# Patient Record
Sex: Female | Born: 1995
Health system: Southern US, Community
[De-identification: ages and names within clinical notes are randomized; demographics above are authoritative.]

## PROBLEM LIST (undated history)

## (undated) DIAGNOSIS — J309 Allergic rhinitis, unspecified: Secondary | ICD-10-CM

## (undated) HISTORY — DX: Allergic rhinitis, unspecified: J30.9

---
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
## 2017-10-16 NOTE — Progress Notes (Signed)
Complete Physical  Assessment and Plan: Medication management -     CBC with Differential/Platelet -     BASIC METABOLIC PANEL WITH GFR -     Hepatic function panel -     Magnesium  Vitamin D deficiency Will not check today, she is NOT on vitamin D, will get on it  Routine general medical examination at a health care facility  Discussed STD testing, safe sex, alcohol and drug awareness, drinking and driving dangers, wearing a seat belt and general safety measures for young adult. - not sexually active, no needs for PAP at this time  BMI 23.0-23.9, adult Monitor  Screening cholesterol level -     Lipid panel  Screening, anemia, deficiency, iron -     Iron,Total/Total Iron Binding Cap -     Vitamin B12  Screening for thyroid disorder -     TSH  Encounter for PPD test -     TB Skin Test  Screening for cardiovascular condition -     EKG 12-Lead   Discussed med's effects and SE's. Screening labs and tests as requested with regular follow-up as recommended. Over 40 minutes of exam, counseling, chart review and critical decision making was performed  HPI  This very nice 22 y.o.female presents for complete physical.  Patient has no major health issues.  Patient reports no complaints at this time.  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 She needs TB and Hep B records.   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 She is doing well, living at home, commuting.  She does workout, sporadic.  Finally, patient has history of Vitamin D Deficiency and last vitamin D was  Lab Results  Component Value Date   VD25OH 26 (L) 03/15/2015   Currently on supplementation  BMI is Body mass index is 23.76 kg/m., she is working on diet and exercise. Wt Readings from Last 3 Encounters:  10/19/17 142 lb 12.8 oz (64.8 kg)  12/03/16 143 lb 3.2 oz (65 kg) (74 %, Z= 0.66)*  10/09/16 146 lb (66.2 kg) (78 %, Z= 0.77)*   * Growth percentiles are based on  CDC (Girls, 2-20 Years) data.    Current Medications:  Current Outpatient Medications on File Prior to Visit  Medication Sig Dispense Refill  . albuterol (VENTOLIN HFA) 108 (90 Base) MCG/ACT inhaler Inhale 2 puffs into the lungs every 4 (four) hours as needed for wheezing or shortness of breath. 1 Inhaler 0  . cetirizine (ZYRTEC) 10 MG tablet Take 10 mg by mouth daily.    . fluticasone (FLONASE) 50 MCG/ACT nasal spray Place 2 sprays into both nostrils at bedtime. 16 g 1  . Multiple Vitamins-Minerals (MULTIVITAMIN PO) Take by mouth daily.    Marland Kitchen OVER THE COUNTER MEDICATION daily. Okra Pepcid     No current facility-administered medications on file prior to visit.    Health Maintenance:   Immunization History  Administered Date(s) Administered  . HPV 9-valent 03/15/2015, 05/28/2015, 09/06/2015  . Meningococcal Conjugate 05/30/2014    TD/TDAP: 2014 Influenza: did not get it Pneumovax: N/A Prevnar 13: N/A HPV vaccines: completed meningitis vaccine 2015  LMP: Patient's last menstrual period was 10/01/2017. Sexually Active: no STD testing offered Pap: not needed MGM: N/A  Allergies:  Allergies  Allergen Reactions  . Amoxicillin Hives   Medical History:  does not have a problem list on file. Surgical History:  She  has no past surgical history on file. Family History:  Her family history is not on file. Social History:   reports that she has never smoked. She has never used smokeless tobacco. She reports that she does not drink alcohol or use drugs.  Review of Systems: Review of Systems  Constitutional: Negative.   HENT: Negative.   Eyes: Negative.   Respiratory: Negative.   Cardiovascular: Negative.   Gastrointestinal: Negative.   Genitourinary: Negative.   Musculoskeletal: Negative.   Skin: Negative.     Physical Exam: Estimated body mass index is 23.76 kg/m as calculated from the following:   Height as of this encounter: 5\' 5"  (1.651 m).   Weight as of this  encounter: 142 lb 12.8 oz (64.8 kg). BP 112/74   Pulse 76   Temp (!) 97.5 F (36.4 C)   Resp 16   Ht 5\' 5"  (1.651 m)   Wt 142 lb 12.8 oz (64.8 kg)   LMP 10/01/2017   SpO2 97%   BMI 23.76 kg/m  General Appearance: Well nourished, in no apparent distress.  Eyes: PERRLA, EOMs, conjunctiva no swelling or erythema, normal fundi and vessels.  Sinuses: No Frontal/maxillary tenderness  ENT/Mouth: Ext aud canals clear, normal light reflex with TMs without erythema, bulging. Good dentition. No erythema, swelling, or exudate on post pharynx. Tonsils not swollen or erythematous. Hearing normal.  Neck: Supple, thyroid normal. No bruits  Respiratory: Respiratory effort normal, BS equal bilaterally without rales, rhonchi, wheezing or stridor.  Cardio: RRR without murmurs, rubs or gallops. Brisk peripheral pulses without edema.  Chest: symmetric, with normal excursions and percussion.  Breasts: Symmetric, without lumps, nipple discharge, retractions.  Abdomen: Soft, nontender, no guarding, rebound, hernias, masses, or organomegaly.  Lymphatics: Non tender without lymphadenopathy.  Genitourinary:  Musculoskeletal: Full ROM all peripheral extremities,5/5 strength, and normal gait.  Skin: Warm, dry without rashes, lesions, ecchymosis. Neuro: Cranial nerves intact, reflexes equal bilaterally. Normal muscle tone, no cerebellar symptoms. Sensation intact.  Psych: Awake and oriented X 3, normal affect, Insight and Judgment appropriate.   EKG: defer  Natasha MullingAmanda Brentley Woods 10:06 AM [facility]

## 2017-10-19 ENCOUNTER — Ambulatory Visit (INDEPENDENT_AMBULATORY_CARE_PROVIDER_SITE_OTHER): Payer: 59 | Admitting: Physician Assistant

## 2017-10-19 ENCOUNTER — Encounter: Payer: Self-pay | Admitting: Physician Assistant

## 2017-10-19 VITALS — BP 112/74 | HR 76 | Temp 97.5°F | Resp 16 | Ht 65.0 in | Wt 142.8 lb

## 2017-10-19 DIAGNOSIS — Z1329 Encounter for screening for other suspected endocrine disorder: Secondary | ICD-10-CM | POA: Diagnosis not present

## 2017-10-19 DIAGNOSIS — Z130 Encounter for screening for diseases of the blood and blood-forming organs and certain disorders involving the immune mechanism: Secondary | ICD-10-CM

## 2017-10-19 DIAGNOSIS — Z111 Encounter for screening for respiratory tuberculosis: Secondary | ICD-10-CM

## 2017-10-19 DIAGNOSIS — Z79899 Other long term (current) drug therapy: Secondary | ICD-10-CM | POA: Diagnosis not present

## 2017-10-19 DIAGNOSIS — Z136 Encounter for screening for cardiovascular disorders: Secondary | ICD-10-CM

## 2017-10-19 DIAGNOSIS — E559 Vitamin D deficiency, unspecified: Secondary | ICD-10-CM

## 2017-10-19 DIAGNOSIS — Z6823 Body mass index (BMI) 23.0-23.9, adult: Secondary | ICD-10-CM

## 2017-10-19 DIAGNOSIS — Z0000 Encounter for general adult medical examination without abnormal findings: Secondary | ICD-10-CM | POA: Diagnosis not present

## 2017-10-19 DIAGNOSIS — Z13220 Encounter for screening for lipoid disorders: Secondary | ICD-10-CM | POA: Diagnosis not present

## 2017-10-19 NOTE — Patient Instructions (Signed)
  Vitamin D goal is between 60-80  Please make sure that you are taking your Vitamin D as directed.   It is very important as a natural anti-inflammatory   helping hair, skin, and nails, as well as reducing stroke and heart attack risk.   It helps your bones and helps with mood.  We want you on at least 5000 IU daily  It also decreases numerous cancer risks so please take it as directed.   Low Vit D is associated with a 200-300% higher risk for CANCER   and 200-300% higher risk for HEART   ATTACK  &  STROKE.    ......................................  It is also associated with higher death rate at younger ages,   autoimmune diseases like Rheumatoid arthritis, Lupus, Multiple Sclerosis.     Also many other serious conditions, like depression, Alzheimer's  Dementia, infertility, muscle aches, fatigue, fibromyalgia - just to name a few.  +++++++++++++++++++  Can get liquid vitamin D from amazon  OR here in [place] at  [facility] [address], [place], [place] [address] Or you can try earth fare    

## 2017-10-20 LAB — BASIC METABOLIC PANEL WITH GFR
BUN: 11 mg/dL (ref 7–25)
CALCIUM: 9.5 mg/dL (ref 8.6–10.2)
CO2: 27 mmol/L (ref 20–32)
Chloride: 105 mmol/L (ref 98–110)
Creat: 0.77 mg/dL (ref 0.50–1.10)
GFR, EST AFRICAN AMERICAN: 129 mL/min/{1.73_m2} (ref >=60)
GFR, EST NON AFRICAN AMERICAN: 111 mL/min/{1.73_m2} (ref >=60)
Glucose, Bld: 90 mg/dL (ref 65–99)
Potassium: 4.6 mmol/L (ref 3.5–5.3)
Sodium: 138 mmol/L (ref 135–146)

## 2017-10-20 LAB — VITAMIN B12: Vitamin B-12: 366 pg/mL (ref 200–1100)

## 2017-10-20 LAB — IRON, TOTAL/TOTAL IRON BINDING CAP
%SAT: 23 % (calc) (ref 11–50)
IRON: 74 ug/dL (ref 40–190)
TIBC: 319 mcg/dL (calc) (ref 250–450)

## 2017-10-20 LAB — TSH: TSH: 1.17 m[IU]/L

## 2017-10-20 LAB — HEPATIC FUNCTION PANEL
AG Ratio: 1.7 (calc) (ref 1.0–2.5)
ALBUMIN MSPROF: 4.5 g/dL (ref 3.6–5.1)
ALT: 9 U/L (ref 6–29)
AST: 15 U/L (ref 10–30)
Alkaline phosphatase (APISO): 64 U/L (ref 33–115)
BILIRUBIN DIRECT: 0.1 mg/dL (ref 0.0–0.2)
GLOBULIN: 2.7 g/dL (ref 1.9–3.7)
Indirect Bilirubin: 0.3 mg/dL (calc) (ref 0.2–1.2)
Total Bilirubin: 0.4 mg/dL (ref 0.2–1.2)
Total Protein: 7.2 g/dL (ref 6.1–8.1)

## 2017-10-20 LAB — CBC WITH DIFFERENTIAL/PLATELET
Basophils Absolute: 32 cells/uL (ref 0–200)
Basophils Relative: 0.5 %
EOS PCT: 0.9 %
Eosinophils Absolute: 58 cells/uL (ref 15–500)
HCT: 40.6 % (ref 35.0–45.0)
Hemoglobin: 14 g/dL (ref 11.7–15.5)
Lymphs Abs: 1702 cells/uL (ref 850–3900)
MCH: 30 pg (ref 27.0–33.0)
MCHC: 34.5 g/dL (ref 32.0–36.0)
MCV: 86.9 fL (ref 80.0–100.0)
MONOS PCT: 8.6 %
MPV: 8.9 fL (ref 7.5–12.5)
NEUTROS PCT: 63.4 %
Neutro Abs: 4058 cells/uL (ref 1500–7800)
PLATELETS: 365 10*3/uL (ref 140–400)
RBC: 4.67 10*6/uL (ref 3.80–5.10)
RDW: 12 % (ref 11.0–15.0)
TOTAL LYMPHOCYTE: 26.6 %
WBC mixed population: 550 cells/uL (ref 200–950)
WBC: 6.4 10*3/uL (ref 3.8–10.8)

## 2017-10-20 LAB — LIPID PANEL
CHOL/HDL RATIO: 3.1 (calc) (ref <5.0)
CHOLESTEROL: 180 mg/dL (ref <200)
HDL: 59 mg/dL (ref >50)
LDL Cholesterol (Calc): 107 mg/dL (calc) — ABNORMAL HIGH
Non-HDL Cholesterol (Calc): 121 mg/dL (calc) (ref <130)
Triglycerides: 59 mg/dL (ref <150)

## 2017-10-20 LAB — MAGNESIUM: Magnesium: 1.8 mg/dL (ref 1.5–2.5)

## 2017-10-21 LAB — TB SKIN TEST
Induration: 0 mm
TB Skin Test: NEGATIVE

## 2018-07-19 ENCOUNTER — Ambulatory Visit (INDEPENDENT_AMBULATORY_CARE_PROVIDER_SITE_OTHER): Payer: 59 | Admitting: Adult Health

## 2018-07-19 ENCOUNTER — Encounter: Payer: Self-pay | Admitting: Adult Health

## 2018-07-19 VITALS — BP 114/72 | HR 100 | Temp 97.7°F | Ht 65.0 in | Wt 135.2 lb

## 2018-07-19 DIAGNOSIS — J0141 Acute recurrent pansinusitis: Secondary | ICD-10-CM | POA: Diagnosis not present

## 2018-07-19 DIAGNOSIS — J309 Allergic rhinitis, unspecified: Secondary | ICD-10-CM

## 2018-07-19 HISTORY — DX: Allergic rhinitis, unspecified: J30.9

## 2018-07-19 MED ORDER — AZITHROMYCIN 250 MG PO TABS: ORAL_TABLET | ORAL | 1 refills | Status: AC

## 2018-07-19 MED ORDER — PROMETHAZINE-DM 6.25-15 MG/5ML PO SYRP: 5.0000 mL | ORAL_SOLUTION | Freq: Four times a day (QID) | ORAL | 1 refills | Status: DC | PRN

## 2018-07-19 MED ORDER — PREDNISONE 20 MG PO TABS: ORAL_TABLET | ORAL | 0 refills | Status: DC

## 2018-07-19 NOTE — Patient Instructions (Signed)

## 2018-07-19 NOTE — Progress Notes (Signed)
Assessment and Plan:  Natasha Woods was seen today for acute visit.  Diagnoses and all orders for this visit:  Acute recurrent pansinusitis Suggested symptomatic OTC remedies. Nasal saline spray for congestion. Nasal steroids, allergy pill, oral steroids offered Add OTC decongestant - sudafed/phenylephrine Follow up as needed if not improving, ER precautions given during holidays for sudden or severe symptoms -     predniSONE (DELTASONE) 20 MG tablet; 2 tablets daily for 3 days, 1 tablet daily for 4 days. -     azithromycin (ZITHROMAX) 250 MG tablet; Take 2 tablets (500 mg) on  Day 1,  followed by 1 tablet (250 mg) once daily on Days 2 through 5. -     promethazine-dextromethorphan (PROMETHAZINE-DM) 6.25-15 MG/5ML syrup; Take 5 mLs by mouth 4 (four) times daily as needed for cough.  Further disposition pending results of labs. Discussed med's effects and SE's.   Over 15 minutes of exam, counseling, chart review, and critical decision making was performed.   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 She does endorse non-productive cough. Newly has chest congestion in the last 48 hours. She denies fever/chills, dyspnea, wheezing, chest pain, palpitations, dizziness, vision changes, severe pain, n/v.   She has hx of  allergies, still taking zyrtec, flonase. She does endorse hx of recurrent sinus issues.  She endorses sinus infection earlier this season that was treated by 3 abx total, cannot recall what had other than doxycycline which didn't help, she cannot take augmentin  No past medical history on file.   Allergies  Allergen Reactions  . Amoxicillin Hives    Current Outpatient Medications on File Prior to Visit  Medication Sig  . albuterol (VENTOLIN HFA) 108 (90 Base) MCG/ACT inhaler Inhale 2 puffs into the lungs every 4 (four) hours as needed for wheezing or shortness of breath.  . cetirizine (ZYRTEC) 10 MG tablet Take 10 mg by mouth daily.  . fluticasone (FLONASE) 50 MCG/ACT nasal spray Place 2 sprays into both nostrils at bedtime.  . Multiple Vitamins-Minerals (MULTIVITAMIN PO) Take by mouth daily.  Marland Kitchen. OVER THE COUNTER MEDICATION daily. Okra Pepcid   No current facility-administered medications on file prior to visit.     ROS: all negative except above.   Physical Exam:  BP 114/72   Pulse 100   Temp 97.7 F (36.5 C)   Ht 5\' 5"  (1.651 m)   Wt 135 lb 3.2 oz (61.3 kg)   LMP 07/11/2018   SpO2 97%   BMI 22.50 kg/m   General Appearance: Well nourished, in no apparent distress. Eyes: PERRLA, EOMs, conjunctiva no swelling or erythema Sinuses: Generalized Frontal/maxillary tenderness ENT/Mouth: Ext aud canals clear, TMs without erythema, bulging. Serous effusion to L. No erythema, swelling, or exudate on post pharynx.  Tonsils not notably swollen or erythematous. Uvula midline. Hearing normal.  Neck: Supple, thyroid normal.  Respiratory: Respiratory effort normal, BS equal bilaterally without rales, rhonchi, wheezing or stridor.  Cardio: RRR with no MRGs. Brisk peripheral pulses without edema.  Abdomen: Soft, + BS.  Non tender. Lymphatics: Generalized anterior cervical tenderness without palpable lymphadenopathy.  Musculoskeletal: no obvious deformity, Symmetrical strength, normal gait.   Skin: Warm, dry without rashes, lesions, ecchymosis.  Neuro: Cranial nerves intact. Normal muscle tone. Psych: Awake and oriented X 3, normal affect, Insight and Judgment appropriate.     Dan MakerAshley C Dreux Mcgroarty, NP 2:00 PM [facility]

## 2018-11-26 NOTE — Progress Notes (Signed)
Complete Physical  Assessment and Plan: Medication management -     CBC with Differential/Platelet -     BASIC METABOLIC PANEL WITH GFR -     Hepatic function panel -     Magnesium  Vitamin D deficiency  she is NOT on vitamin D, will get on it  Routine general medical examination at a health care facility  Discussed STD testing, safe sex, alcohol and drug awareness, drinking and driving dangers, wearing a seat belt and general safety measures for young adult. - not sexually active, no needs for PAP at this time  BMI 23.0-23.9, adult Monitor  Screening cholesterol level -     Lipid panel  Screening, anemia, deficiency, iron -     Vitamin B12  Screening for thyroid disorder -     TSH  Menorrhagia with regular cycle -     ibuprofen (ADVIL) 800 MG tablet; Take 1 tablet (800 mg total) by mouth every 8 (eight) hours as needed for cramping. - counseled, if continues may get pap/US/discuss BCP  Screening for hematuria or proteinuria -     Urinalysis, Routine w reflex microscopic -     Microalbumin / creatinine urine ratio   Discussed med's effects and SE's. Screening labs and tests as requested with regular follow-up as recommended. Over 40 minutes of exam, counseling, chart review and critical decision making was performed  HPI  This very nice 23 y.o.female presents for complete physical.  Patient has no major health issues.  Patient reports no complaints at this time.   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 She is doing well, living at home, commuting.  Menses are normal, does have severe cramping during menses, no heavy bleeding, no pain between cycles.  She does workout, sporadic.  Finally, patient has history of Vitamin D Deficiency and last vitamin D was  Lab Results  Component Value Date   VD25OH 26 (L) 03/15/2015   Currently NOT on supplementation  BMI is Body mass index is 22.86 kg/m., she is working on diet and exercise. Wt Readings  from Last 3 Encounters:  11/29/18 137 lb 6.4 oz (62.3 kg)  07/19/18 135 lb 3.2 oz (61.3 kg)  10/19/17 142 lb 12.8 oz (64.8 kg)   Her blood pressure has been controlled at home, today their BP is BP: 118/76  She does workout, she walks 4-5 miles a day. She denies chest pain, shortness of breath, dizziness.  She is not on cholesterol medication and denies myalgias. Her cholesterol is at goal. The cholesterol last visit was:   Lab Results  Component Value Date   CHOL 180 10/19/2017   HDL 59 10/19/2017   LDLCALC 107 (H) 10/19/2017   TRIG 59 10/19/2017   CHOLHDL 3.1 10/19/2017   Last Z3G in the office was:  Lab Results  Component Value Date   HGBA1C 5.1 03/15/2015     Current Medications:  Current Outpatient Medications on File Prior to Visit  Medication Sig Dispense Refill  . albuterol (VENTOLIN HFA) 108 (90 Base) MCG/ACT inhaler Inhale 2 puffs into the lungs every 4 (four) hours as needed for wheezing or shortness of breath. 1 Inhaler 0  . cetirizine (ZYRTEC) 10 MG tablet Take 10 mg by mouth daily.    . fluticasone (FLONASE) 50 MCG/ACT nasal spray Place 2 sprays into both nostrils at bedtime. 16 g 1  . Multiple Vitamins-Minerals (MULTIVITAMIN PO) Take by mouth daily.    Marland Kitchen OVER THE COUNTER MEDICATION daily. Okra Pepcid  No current facility-administered medications on file prior to visit.    Health Maintenance:   Immunization History  Administered Date(s) Administered  . HPV 9-valent 03/15/2015, 05/28/2015, 09/06/2015  . Meningococcal Conjugate 05/30/2014  . PPD Test 10/19/2017    TD/TDAP: 2014 Influenza: 2019 on campus Pneumovax: N/A Prevnar 13: N/A HPV vaccines: completed meningitis vaccine 2015  LMP: Patient's last menstrual period was 11/10/2018. Sexually Active: no STD testing offered Pap: not needed at this time.  MGM: N/A  Allergies:  Allergies  Allergen Reactions  . Amoxicillin Hives   Medical History:  has Allergic rhinitis; Menorrhagia with regular  cycle; Vitamin D deficiency; and Medication management on their problem list. Surgical History:  She  has no past surgical history on file. Family History:  Her family history includes Cancer in her maternal grandfather and paternal grandfather. Social History:   reports that she has never smoked. She has never used smokeless tobacco. She reports that she does not drink alcohol or use drugs.  Review of Systems: Review of Systems  Constitutional: Negative.   HENT: Negative.   Eyes: Negative.   Respiratory: Negative.   Cardiovascular: Negative.   Gastrointestinal: Negative.   Genitourinary: Negative.   Musculoskeletal: Negative.   Skin: Negative.     Physical Exam: Estimated body mass index is 22.86 kg/m as calculated from the following:   Height as of this encounter: 5\' 5"  (1.651 m).   Weight as of this encounter: 137 lb 6.4 oz (62.3 kg). BP 118/76   Pulse 97   Temp 97.8 F (36.6 C)   Ht 5\' 5"  (1.651 m)   Wt 137 lb 6.4 oz (62.3 kg)   LMP 11/10/2018   SpO2 97%   BMI 22.86 kg/m  General Appearance: Well nourished, in no apparent distress.  Eyes: PERRLA, EOMs, conjunctiva no swelling or erythema, normal fundi and vessels.  Sinuses: No Frontal/maxillary tenderness  ENT/Mouth: Ext aud canals clear, normal light reflex with TMs without erythema, bulging. Good dentition. No erythema, swelling, or exudate on post pharynx. Tonsils not swollen or erythematous. Hearing normal.  Neck: Supple, thyroid normal. No bruits  Respiratory: Respiratory effort normal, BS equal bilaterally without rales, rhonchi, wheezing or stridor.  Cardio: RRR without murmurs, rubs or gallops. Brisk peripheral pulses without edema.  Chest: symmetric, with normal excursions and percussion.  Breasts: Symmetric, without lumps, nipple discharge, retractions.  Abdomen: Soft, nontender, no guarding, rebound, hernias, masses, or organomegaly.  Lymphatics: Non tender without lymphadenopathy.  Genitourinary:  defer Musculoskeletal: Full ROM all peripheral extremities,5/5 strength, and normal gait.  Skin: Warm, dry without rashes, lesions, ecchymosis. Neuro: Cranial nerves intact, reflexes equal bilaterally. Normal muscle tone, no cerebellar symptoms. Sensation intact.  Psych: Awake and oriented X 3, normal affect, Insight and Judgment appropriate.   EKG: defer  Quentin MullingAmanda Collier 9:59 AM [facility]

## 2018-11-29 ENCOUNTER — Other Ambulatory Visit: Payer: Self-pay

## 2018-11-29 ENCOUNTER — Ambulatory Visit (INDEPENDENT_AMBULATORY_CARE_PROVIDER_SITE_OTHER): Payer: 59 | Admitting: Physician Assistant

## 2018-11-29 ENCOUNTER — Encounter: Payer: Self-pay | Admitting: Physician Assistant

## 2018-11-29 VITALS — BP 118/76 | HR 97 | Temp 97.8°F | Ht 65.0 in | Wt 137.4 lb

## 2018-11-29 DIAGNOSIS — Z1389 Encounter for screening for other disorder: Secondary | ICD-10-CM

## 2018-11-29 DIAGNOSIS — E559 Vitamin D deficiency, unspecified: Secondary | ICD-10-CM | POA: Diagnosis not present

## 2018-11-29 DIAGNOSIS — N920 Excessive and frequent menstruation with regular cycle: Secondary | ICD-10-CM

## 2018-11-29 DIAGNOSIS — Z1329 Encounter for screening for other suspected endocrine disorder: Secondary | ICD-10-CM | POA: Diagnosis not present

## 2018-11-29 DIAGNOSIS — J309 Allergic rhinitis, unspecified: Secondary | ICD-10-CM

## 2018-11-29 DIAGNOSIS — Z79899 Other long term (current) drug therapy: Secondary | ICD-10-CM | POA: Insufficient documentation

## 2018-11-29 DIAGNOSIS — Z0000 Encounter for general adult medical examination without abnormal findings: Secondary | ICD-10-CM | POA: Diagnosis not present

## 2018-11-29 DIAGNOSIS — Z13220 Encounter for screening for lipoid disorders: Secondary | ICD-10-CM

## 2018-11-29 DIAGNOSIS — Z130 Encounter for screening for diseases of the blood and blood-forming organs and certain disorders involving the immune mechanism: Secondary | ICD-10-CM

## 2018-11-29 MED ORDER — IBUPROFEN 800 MG PO TABS: 800.0000 mg | ORAL_TABLET | Freq: Three times a day (TID) | ORAL | 0 refills | Status: DC | PRN

## 2018-11-29 NOTE — Patient Instructions (Addendum)
VITAMIN D IS IMPORTANT  Vitamin D goal is between 60-80  Please make sure that you are taking your Vitamin D as directed.   It is very important as a natural anti-inflammatory   helping hair, skin, and nails, as well as reducing stroke and heart attack risk.   It helps your bones and helps with mood.  We want you on at least 5000 IU daily  It also decreases numerous cancer risks so please take it as directed.   Low Vit D is associated with a 200-300% higher risk for CANCER   and 200-300% higher risk for HEART   ATTACK  &  STROKE.    .....................................Marland Kitchen  It is also associated with higher death rate at younger ages,   autoimmune diseases like Rheumatoid arthritis, Lupus, Multiple Sclerosis.     Also many other serious conditions, like depression, Alzheimer's  Dementia, infertility, muscle aches, fatigue, fibromyalgia - just to name a few.  +++++++++++++++++++  Can get liquid vitamin D from [place]  OR here in [place] at  [facility] alternatives [address], [place], [place] [address] Or you can try earth fare   B12 is low end of normal, add sublingual B12. The sublingual matters more than the dose, get any dose but make sure it melts in your mouth.   Will help with energy, memory/concentration, decrease nerve pain, and help with weight loss. B12 is water soluble vitamin so you can not over dose on it,.

## 2018-11-30 LAB — COMPLETE METABOLIC PANEL WITH GFR
AG Ratio: 1.7 (calc) (ref 1.0–2.5)
ALT: 10 U/L (ref 6–29)
AST: 14 U/L (ref 10–30)
Albumin: 4.3 g/dL (ref 3.6–5.1)
Alkaline phosphatase (APISO): 53 U/L (ref 31–125)
BUN: 11 mg/dL (ref 7–25)
CO2: 28 mmol/L (ref 20–32)
Calcium: 9.7 mg/dL (ref 8.6–10.2)
Chloride: 106 mmol/L (ref 98–110)
Creat: 0.73 mg/dL (ref 0.50–1.10)
GFR, Est African American: 136 mL/min/{1.73_m2} (ref >=60)
GFR, Est Non African American: 118 mL/min/{1.73_m2} (ref >=60)
Globulin: 2.6 g/dL (calc) (ref 1.9–3.7)
Glucose, Bld: 68 mg/dL (ref 65–99)
Potassium: 4.5 mmol/L (ref 3.5–5.3)
Sodium: 140 mmol/L (ref 135–146)
Total Bilirubin: 0.5 mg/dL (ref 0.2–1.2)
Total Protein: 6.9 g/dL (ref 6.1–8.1)

## 2018-11-30 LAB — LIPID PANEL
Cholesterol: 167 mg/dL (ref <200)
HDL: 53 mg/dL (ref >=50)
LDL Cholesterol (Calc): 97 mg/dL (calc)
Non-HDL Cholesterol (Calc): 114 mg/dL (calc) (ref <130)
Total CHOL/HDL Ratio: 3.2 (calc) (ref <5.0)
Triglycerides: 81 mg/dL (ref <150)

## 2018-11-30 LAB — CBC WITH DIFFERENTIAL/PLATELET
Absolute Monocytes: 404 cells/uL (ref 200–950)
Basophils Absolute: 39 cells/uL (ref 0–200)
Basophils Relative: 0.9 %
Eosinophils Absolute: 60 cells/uL (ref 15–500)
Eosinophils Relative: 1.4 %
HCT: 41.9 % (ref 35.0–45.0)
Hemoglobin: 14.1 g/dL (ref 11.7–15.5)
Lymphs Abs: 1170 cells/uL (ref 850–3900)
MCH: 29.7 pg (ref 27.0–33.0)
MCHC: 33.7 g/dL (ref 32.0–36.0)
MCV: 88.4 fL (ref 80.0–100.0)
MPV: 9.1 fL (ref 7.5–12.5)
Monocytes Relative: 9.4 %
Neutro Abs: 2627 cells/uL (ref 1500–7800)
Neutrophils Relative %: 61.1 %
Platelets: 309 10*3/uL (ref 140–400)
RBC: 4.74 10*6/uL (ref 3.80–5.10)
RDW: 12.2 % (ref 11.0–15.0)
Total Lymphocyte: 27.2 %
WBC: 4.3 10*3/uL (ref 3.8–10.8)

## 2018-11-30 LAB — VITAMIN B12: Vitamin B-12: 333 pg/mL (ref 200–1100)

## 2018-11-30 LAB — MICROALBUMIN / CREATININE URINE RATIO
Creatinine, Urine: 213 mg/dL (ref 20–275)
Microalb Creat Ratio: 4 mcg/mg creat (ref <30)
Microalb, Ur: 0.9 mg/dL

## 2018-11-30 LAB — URINALYSIS, ROUTINE W REFLEX MICROSCOPIC
Bilirubin Urine: NEGATIVE
Glucose, UA: NEGATIVE
Hgb urine dipstick: NEGATIVE
Leukocytes,Ua: NEGATIVE
Nitrite: NEGATIVE
Protein, ur: NEGATIVE
Specific Gravity, Urine: 1.022 (ref 1.001–1.03)
pH: 5.5 (ref 5.0–8.0)

## 2018-11-30 LAB — MAGNESIUM: Magnesium: 1.9 mg/dL (ref 1.5–2.5)

## 2018-11-30 LAB — TSH: TSH: 1.21 mIU/L

## 2018-11-30 LAB — VITAMIN D 25 HYDROXY (VIT D DEFICIENCY, FRACTURES): Vit D, 25-Hydroxy: 25 ng/mL — ABNORMAL LOW (ref 30–100)

## 2019-10-01 ENCOUNTER — Ambulatory Visit: Payer: Self-pay | Attending: Internal Medicine

## 2019-10-01 DIAGNOSIS — Z23 Encounter for immunization: Secondary | ICD-10-CM | POA: Insufficient documentation

## 2019-10-01 NOTE — Progress Notes (Signed)
   Covid-19 Vaccination Clinic  Name:  Nakenya Theall    MRN: [id number] DOB: [birth year]  10/01/2019  Ms. Hemrick was observed post Covid-19 immunization for 15 minutes without incident. She was provided with Vaccine Information Sheet and instruction to access the V-Safe system.   Ms. Jurich was instructed to call 911 with any severe reactions post vaccine: Marland Kitchen Difficulty breathing  . Swelling of face and throat  . A fast heartbeat  . A bad rash all over body  . Dizziness and weakness   Immunizations Administered    Name Date Dose VIS Date Route   Pfizer COVID-19 Vaccine 10/01/2019  5:38 PM 0.3 mL 07/08/2019 Intramuscular   Manufacturer: [employer], [employer]   Lot: [id number]   NDC: [address]-1

## 2019-10-22 ENCOUNTER — Ambulatory Visit: Payer: Self-pay | Attending: Internal Medicine

## 2019-10-22 ENCOUNTER — Ambulatory Visit: Payer: Self-pay

## 2019-10-22 DIAGNOSIS — Z23 Encounter for immunization: Secondary | ICD-10-CM

## 2019-10-22 NOTE — Progress Notes (Signed)
   Covid-19 Vaccination Clinic  Name:  Natasha Woods    MRN: [id number] DOB: 1996/06/25  10/22/2019  [name] was observed post Covid-19 immunization for 15 minutes without incident. She was provided with Vaccine Information Sheet and instruction to access the V-Safe system.   [name] was instructed to call 911 with any severe reactions post vaccine: Marland Kitchen Difficulty breathing  . Swelling of face and throat  . A fast heartbeat  . A bad rash all over body  . Dizziness and weakness   Immunizations Administered    Name Date Dose VIS Date Route   Pfizer COVID-19 Vaccine 10/22/2019  8:22 AM 0.3 mL 07/08/2019 Intramuscular   Manufacturer: [employer], [employer]   Lot: [id number]   NDC: [address]-8

## 2019-12-06 ENCOUNTER — Encounter: Payer: 59 | Admitting: Physician Assistant

## 2020-01-24 ENCOUNTER — Ambulatory Visit: Payer: 59 | Admitting: Internal Medicine

## 2020-01-26 ENCOUNTER — Ambulatory Visit: Payer: 59 | Admitting: Internal Medicine

## 2020-02-15 NOTE — Progress Notes (Signed)
Complete Physical  Assessment and Plan:  Routine general medical examination at a health care facility  Discussed STD testing, safe sex, alcohol and drug awareness, drinking and driving dangers, wearing a seat belt and general safety measures for young adult. - not sexually active, no needs for PAP at this time  Vitamin D deficiency Start 2000 IU; defer check this year, get next  BMI 22 Continue to recommend diet heavy in fruits and veggies and low in animal meats, cheeses, and dairy products, appropriate calorie intake Discuss exercise recommendations routinely Continue to monitor weight at each visit   B12 def Was low last year, never started supplement; discussed sublingual and dietary changes Declines check this year but get next year   Family history of thyroid disorder Screening for thyroid disorder -     TSH  Menorrhagia with regular cycle - controlled with ibuprofen 600 mg PRN - declines BCP - counseled, if worse may get pap/US  Medication management -     CBC with Differential/Platelet -     CMP/GFR -     Magnesium  Screening for hematuria or proteinuria -     Urinalysis, Routine w reflex microscopic  TB screening PPD done; follow up measurement in 48-72 hours for work clearance    Discussed med's effects and SE's. Screening labs and tests as requested with regular follow-up as recommended. Over 40 minutes of exam, counseling, chart review and critical decision making was performed  Future Appointments  Date Time Provider Department Center  02/18/2021  3:00 PM Judd Gaudier, NP [facility] None     HPI  This very nice 24 y.o.female presents for complete physical. She has Allergic rhinitis; Menorrhagia with regular cycle; Vitamin D deficiency; and Medication management on their problem list. Patient reports no complaints at this time.   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 She is staying at home to save for a  down payment on a house. Needs TB test for work clearance.  Dating but never sexually active, plans to wait until marriage.    Menses are normal, does have severe cramping during menses (manages with 600 mg ibuprofen), no heavy bleeding, no pain between cycles. Also has diarrhea while on her cycle, takes imodium to manage. She declines HBC.   Has year round allergies, prone to severe sinus infections. Takes zyrtec, flonase, mucinex, etc with fair results.   BMI is Body mass index is 22.47 kg/m., she has been working on diet and exercise, walking with her friend more since the pandemic, doing 3.5 miles over 1 hour or, 3-5 days a week.  Lots of water - 32 oz x 4 water daily minimum No caffeine,  Gets minimum 6 hours of sleep, prioritizes  Eats at home, estimates 2-3 servings of veggies, 3-4 servings of fruit daily  Leans meats mostly, not much processed  No alcohol  Wt Readings from Last 3 Encounters:  02/16/20 135 lb (61.2 kg)  11/29/18 137 lb 6.4 oz (62.3 kg)  07/19/18 135 lb 3.2 oz (61.3 kg)    Her blood pressure has been controlled at home, today their BP is BP: 110/74  She does workout,  She denies chest pain, shortness of breath, dizziness.   She is not on cholesterol medication and denies myalgias. Her cholesterol is at goal. The cholesterol last visit was:   Lab Results  Component Value Date   CHOL 167 11/29/2018   HDL 53 11/29/2018   LDLCALC 97 11/29/2018  TRIG 81 11/29/2018   CHOLHDL 3.2 11/29/2018   Last O0H in the office was:  Lab Results  Component Value Date   HGBA1C 5.1 03/15/2015   Patient is not on Vitamin D supplement, unsure how much to take  Lab Results  Component Value Date   VD25OH 25 (L) 11/29/2018     Never started supplement, wants information on diet/lifestyle;  Lab Results  Component Value Date   VITAMINB12 333 11/29/2018      Current Medications:  Current Outpatient Medications on File Prior to Visit  Medication Sig Dispense Refill    albuterol (VENTOLIN HFA) 108 (90 Base) MCG/ACT inhaler Inhale 2 puffs into the lungs every 4 (four) hours as needed for wheezing or shortness of breath. 1 Inhaler 0   cetirizine (ZYRTEC) 10 MG tablet Take 10 mg by mouth daily.     fluticasone (FLONASE) 50 MCG/ACT nasal spray Place 2 sprays into both nostrils at bedtime. 16 g 1   ibuprofen (ADVIL) 800 MG tablet Take 1 tablet (800 mg total) by mouth every 8 (eight) hours as needed for cramping. 90 tablet 0   Multiple Vitamins-Minerals (MULTIVITAMIN PO) Take by mouth daily.     OVER THE COUNTER MEDICATION daily. Okra Pepcid     No current facility-administered medications on file prior to visit.   Health Maintenance:   Immunization History  Administered Date(s) Administered   HPV 9-valent 03/15/2015, 05/28/2015, 09/06/2015   Meningococcal Conjugate 05/30/2014   PFIZER SARS-COV-2 Vaccination 10/01/2019, 10/22/2019   PPD Test 10/19/2017    TD/TDAP: 2014 Influenza: 2019 on campus Pneumovax: N/A  Prevnar 13: N/A HPV vaccines: completed meningitis vaccine 2015 Covid 19: 2/2, 2021, pfizer   LMP: Patient's last menstrual period was 02/11/2020 (exact date). Sexually Active: no STD testing offered Pap: not needed at this time - never sexually active  MGM: N/A  Allergies:  Allergies  Allergen Reactions   Amoxicillin Hives   Medical History:  has Allergic rhinitis; Menorrhagia with regular cycle; Vitamin D deficiency; and Medication management on their problem list. Surgical History:  She  has no past surgical history on file. Family History:  Her family history includes Cancer in her maternal grandfather and paternal grandfather. Social History:   reports that she has never smoked. She has never used smokeless tobacco. She reports that she does not drink alcohol and does not use drugs.  Review of Systems: Review of Systems  Constitutional: Negative.  Negative for malaise/fatigue and weight loss.  HENT: Negative.  Negative  for hearing loss and tinnitus.   Eyes: Negative.  Negative for blurred vision and double vision.  Respiratory: Negative.  Negative for cough, shortness of breath and wheezing.   Cardiovascular: Negative.  Negative for chest pain, palpitations, orthopnea, claudication and leg swelling.  Gastrointestinal: Negative.  Negative for abdominal pain, blood in stool, constipation, diarrhea, heartburn, melena, nausea and vomiting.  Genitourinary: Negative.   Musculoskeletal: Negative.  Negative for joint pain and myalgias.  Skin: Negative.  Negative for rash.  Neurological: Negative for dizziness, tingling, sensory change, weakness and headaches.  Endo/Heme/Allergies: Negative for polydipsia.  Psychiatric/Behavioral: Negative.   All other systems reviewed and are negative.   Physical Exam: Estimated body mass index is 22.47 kg/m as calculated from the following:   Height as of this encounter: 5\' 5"  (1.651 m).   Weight as of this encounter: 135 lb (61.2 kg). BP 110/74    Pulse 82    Temp 97.7 F (36.5 C)    Ht 5\' 5"  (1.651  m)    Wt 135 lb (61.2 kg)    LMP 02/11/2020 (Exact Date)    SpO2 96%    BMI 22.47 kg/m  General Appearance: Well nourished, in no apparent distress.  Eyes: PERRLA, EOMs, conjunctiva no swelling or erythema Sinuses: No Frontal/maxillary tenderness  ENT/Mouth: Ext aud canals clear, normal light reflex with TMs without erythema, bulging. Good dentition. No erythema, swelling, or exudate on post pharynx. Tonsils not swollen or erythematous. Hearing normal.  Neck: Supple, thyroid normal. No bruits  Respiratory: Respiratory effort normal, BS equal bilaterally without rales, rhonchi, wheezing or stridor.  Cardio: RRR without murmurs, rubs or gallops. Brisk peripheral pulses without edema.  Chest: symmetric, with normal excursions and percussion.  Breasts: Symmetric, without lumps, nipple discharge, retractions.  Abdomen: Soft, nontender, no guarding, rebound, hernias, masses, or  organomegaly.  Lymphatics: Non tender without lymphadenopathy.  Genitourinary: defer Musculoskeletal: Full ROM all peripheral extremities,5/5 strength, and normal gait.  Skin: Warm, dry without rashes, lesions, ecchymosis. Neuro: Cranial nerves intact, reflexes equal bilaterally. Normal muscle tone, no cerebellar symptoms. Sensation intact.  Psych: Awake and oriented X 3, normal affect, Insight and Judgment appropriate.   EKG: defer  Dan Maker 3:33 PM [facility]

## 2020-02-16 ENCOUNTER — Other Ambulatory Visit: Payer: Self-pay

## 2020-02-16 ENCOUNTER — Ambulatory Visit (INDEPENDENT_AMBULATORY_CARE_PROVIDER_SITE_OTHER): Payer: 59 | Admitting: Adult Health

## 2020-02-16 ENCOUNTER — Encounter: Payer: Self-pay | Admitting: Adult Health

## 2020-02-16 VITALS — BP 110/74 | HR 82 | Temp 97.7°F | Ht 65.0 in | Wt 135.0 lb

## 2020-02-16 DIAGNOSIS — Z130 Encounter for screening for diseases of the blood and blood-forming organs and certain disorders involving the immune mechanism: Secondary | ICD-10-CM

## 2020-02-16 DIAGNOSIS — N920 Excessive and frequent menstruation with regular cycle: Secondary | ICD-10-CM

## 2020-02-16 DIAGNOSIS — Z6822 Body mass index (BMI) 22.0-22.9, adult: Secondary | ICD-10-CM

## 2020-02-16 DIAGNOSIS — Z0000 Encounter for general adult medical examination without abnormal findings: Secondary | ICD-10-CM

## 2020-02-16 DIAGNOSIS — Z1329 Encounter for screening for other suspected endocrine disorder: Secondary | ICD-10-CM

## 2020-02-16 DIAGNOSIS — E538 Deficiency of other specified B group vitamins: Secondary | ICD-10-CM

## 2020-02-16 DIAGNOSIS — J309 Allergic rhinitis, unspecified: Secondary | ICD-10-CM

## 2020-02-16 DIAGNOSIS — Z111 Encounter for screening for respiratory tuberculosis: Secondary | ICD-10-CM | POA: Diagnosis not present

## 2020-02-16 DIAGNOSIS — E559 Vitamin D deficiency, unspecified: Secondary | ICD-10-CM

## 2020-02-16 DIAGNOSIS — Z1389 Encounter for screening for other disorder: Secondary | ICD-10-CM

## 2020-02-16 DIAGNOSIS — Z8349 Family history of other endocrine, nutritional and metabolic diseases: Secondary | ICD-10-CM

## 2020-02-16 DIAGNOSIS — Z79899 Other long term (current) drug therapy: Secondary | ICD-10-CM

## 2020-02-16 NOTE — Patient Instructions (Addendum)
  Ms. Eskelson , Thank you for taking time to come for your Annual Wellness Visit. I appreciate your ongoing commitment to your health goals. Please review the following plan we discussed and let me know if I can assist you in the future.   This is a list of the screening recommended for you and due dates:  Health Maintenance  Topic Date Due  .  Hepatitis C: One time screening is recommended by Center for Disease Control  (CDC) for  adults born from [birth year] through 1963.   Never done  . HIV Screening  Never done  . Tetanus Vaccine  Never done  . Pap Smear  Never done  . Pap Smear  Never done  . Flu Shot  02/26/2020  . COVID-19 Vaccine  Completed     Get on 2000 IU daily vitamin D  B12 levels are low end of normal -   Consider adding a multivitamin with B vitamins, a b complex, or sublingual b12 (dissolves under tongue)  Good food sources of vitamin B12 are seafood/fish, almond milk, soymilk, lamb, yogurt, chicken, mozzarella cheese and you could try to incorporate these (fish and almond milk are your healthiest options for cholesterol - aim to get in at least 2 servings of fish weekly). Nutritional yeast is also a great    Can try adding soluble fiber supplement or increase in diet (whole oats, etc) for IBS and diarrhea    Know what a healthy weight is for you (roughly BMI <25) and aim to maintain this  Aim for 7+ servings of fruits and vegetables daily  65-80+ fluid ounces of water or unsweet tea for healthy kidneys  Limit to max 1 drink of alcohol per day; avoid smoking/tobacco  Limit animal fats in diet for cholesterol and heart health - choose grass fed whenever available  Avoid highly processed foods, and foods high in saturated/trans fats  Aim for low stress - take time to unwind and care for your mental health  Aim for 150 min of moderate intensity exercise weekly for heart health, and weights twice weekly for bone health  Aim for 7-9 hours of sleep daily    A great  goal to work towards is aiming to get in a serving daily of some of the most nutritionally dense foods - G- BOMBS daily

## 2020-02-17 LAB — CBC WITH DIFFERENTIAL/PLATELET
Absolute Monocytes: 419 cells/uL (ref 200–950)
Basophils Absolute: 28 cells/uL (ref 0–200)
Basophils Relative: 0.6 %
Eosinophils Absolute: 60 cells/uL (ref 15–500)
Eosinophils Relative: 1.3 %
HCT: 40.3 % (ref 35.0–45.0)
Hemoglobin: 13.2 g/dL (ref 11.7–15.5)
Lymphs Abs: 1845 cells/uL (ref 850–3900)
MCH: 28.9 pg (ref 27.0–33.0)
MCHC: 32.8 g/dL (ref 32.0–36.0)
MCV: 88.4 fL (ref 80.0–100.0)
MPV: 9.5 fL (ref 7.5–12.5)
Monocytes Relative: 9.1 %
Neutro Abs: 2249 cells/uL (ref 1500–7800)
Neutrophils Relative %: 48.9 %
Platelets: 306 10*3/uL (ref 140–400)
RBC: 4.56 10*6/uL (ref 3.80–5.10)
RDW: 12.5 % (ref 11.0–15.0)
Total Lymphocyte: 40.1 %
WBC: 4.6 10*3/uL (ref 3.8–10.8)

## 2020-02-17 LAB — COMPLETE METABOLIC PANEL WITH GFR
AG Ratio: 1.5 (calc) (ref 1.0–2.5)
ALT: 12 U/L (ref 6–29)
AST: 14 U/L (ref 10–30)
Albumin: 4.2 g/dL (ref 3.6–5.1)
Alkaline phosphatase (APISO): 50 U/L (ref 31–125)
BUN: 8 mg/dL (ref 7–25)
CO2: 28 mmol/L (ref 20–32)
Calcium: 9.7 mg/dL (ref 8.6–10.2)
Chloride: 108 mmol/L (ref 98–110)
Creat: 0.59 mg/dL (ref 0.50–1.10)
GFR, Est African American: 151 mL/min/{1.73_m2} (ref >=60)
GFR, Est Non African American: 130 mL/min/{1.73_m2} (ref >=60)
Globulin: 2.8 g/dL (calc) (ref 1.9–3.7)
Glucose, Bld: 83 mg/dL (ref 65–99)
Potassium: 4.2 mmol/L (ref 3.5–5.3)
Sodium: 141 mmol/L (ref 135–146)
Total Bilirubin: 0.3 mg/dL (ref 0.2–1.2)
Total Protein: 7 g/dL (ref 6.1–8.1)

## 2020-02-17 LAB — URINALYSIS, ROUTINE W REFLEX MICROSCOPIC
Bilirubin Urine: NEGATIVE
Glucose, UA: NEGATIVE
Hgb urine dipstick: NEGATIVE
Ketones, ur: NEGATIVE
Leukocytes,Ua: NEGATIVE
Nitrite: NEGATIVE
Protein, ur: NEGATIVE
Specific Gravity, Urine: 1.014 (ref 1.001–1.03)
pH: 7 (ref 5.0–8.0)

## 2020-02-17 LAB — MAGNESIUM: Magnesium: 2.2 mg/dL (ref 1.5–2.5)

## 2020-02-17 LAB — TSH: TSH: 0.84 mIU/L

## 2020-02-20 LAB — TB SKIN TEST
Induration: 0 mm
TB Skin Test: NEGATIVE

## 2020-10-15 ENCOUNTER — Other Ambulatory Visit: Payer: Self-pay | Admitting: Adult Health

## 2020-10-15 DIAGNOSIS — Z124 Encounter for screening for malignant neoplasm of cervix: Secondary | ICD-10-CM

## 2020-10-19 ENCOUNTER — Other Ambulatory Visit: Payer: Self-pay

## 2020-10-19 ENCOUNTER — Ambulatory Visit (INDEPENDENT_AMBULATORY_CARE_PROVIDER_SITE_OTHER): Payer: BC Managed Care – PPO | Admitting: Adult Health

## 2020-10-19 ENCOUNTER — Encounter: Payer: Self-pay | Admitting: Adult Health

## 2020-10-19 ENCOUNTER — Ambulatory Visit: Payer: 59 | Admitting: Adult Health

## 2020-10-19 VITALS — BP 118/72 | HR 98 | Temp 98.1°F | Wt 132.0 lb

## 2020-10-19 DIAGNOSIS — R42 Dizziness and giddiness: Secondary | ICD-10-CM

## 2020-10-19 DIAGNOSIS — N939 Abnormal uterine and vaginal bleeding, unspecified: Secondary | ICD-10-CM

## 2020-10-19 DIAGNOSIS — N920 Excessive and frequent menstruation with regular cycle: Secondary | ICD-10-CM

## 2020-10-19 DIAGNOSIS — R5383 Other fatigue: Secondary | ICD-10-CM

## 2020-10-19 LAB — POCT URINE PREGNANCY: Preg Test, Ur: NEGATIVE

## 2020-10-19 MED ORDER — DROSPIRENONE-ETHINYL ESTRADIOL 3-0.03 MG PO TABS: 1.0000 | ORAL_TABLET | Freq: Every day | ORAL | 2 refills | Status: DC

## 2020-10-19 NOTE — Patient Instructions (Signed)
Abnormal Uterine Bleeding  Abnormal uterine bleeding is unusual bleeding from the uterus. It includes bleeding after sex, or bleeding or spotting between menstrual periods. It may also include bleeding that is heavier than normal, menstrual periods that last longer than usual, or bleeding that occurs after menopause. Abnormal uterine bleeding can affect teenagers, women in their reproductive years, pregnant women, and women who have reached menopause. Common causes of abnormal uterine bleeding include:  Pregnancy.  Growths of tissue (polyps).  Benign tumors or growths in the uterus (fibroids). These are not cancer.  Infection.  Cancer.  Too much or too little of some hormones in the body (hormonal imbalances). Any type of abnormal bleeding should be checked by a health care provider. Many cases are minor and simple to treat, but others may be more serious. Treatment will depend on the cause and severity of the bleeding. Follow these instructions at home: Medicines  Take over-the-counter and prescription medicines only as told by your health care provider.  Tell your health care provider about other medicines that you take. You may be asked to stop taking aspirin or medicines that contain aspirin. These medicines can make bleeding worse.  If you were prescribed iron pills, take them as told by your health care provider. Iron pills help to replace iron that your body loses because of this condition. Managing constipation In cases of severe bleeding, you may be asked to increase your iron intake to treat anemia. This may cause constipation. To prevent or treat constipation, you may need to:  Drink enough fluid to keep your urine pale yellow.  Take over-the-counter or prescription medicines.  Eat foods that are high in fiber, such as beans, whole grains, and fresh fruits and vegetables.  Limit foods that are high in fat and processed sugars, such as fried or sweet foods. General  instructions  Monitor your condition for any changes.  Do not use tampons, douche, or have sex until your health care provider says these things are okay.  Change your pads often.  Get regular exams. This includes pelvic exams and cervical cancer screenings. ? It is up to you to get the results of any tests that are done. Ask your health care provider, or the department that is doing the tests, when your results will be ready.  Keep all follow-up visits as told by your health care provider. This is important. Contact a health care provider if you:  Have bleeding that lasts for more than 1 week.  Feel dizzy at times.  Feel nauseous or you vomit.  Feel light-headed or weak.  Notice any other changes that show that your condition is getting worse. Get help right away if you:  Pass out.  Have bleeding that soaks through a pad every hour.  Have pain in the abdomen.  Have a fever or chills.  Become sweaty or weak.  Pass large blood clots from your vagina. Summary  Abnormal uterine bleeding is unusual bleeding from the uterus.  Any type of abnormal bleeding should be evaluated by a health care provider. Many cases are minor and simple to treat, but others may be more serious.  Treatment will depend on the cause of the bleeding.  Get help right away if you pass out, you have bleeding that soaks through a pad every hour, or you pass large blood clots from your vagina. This information is not intended to replace advice given to you by your health care provider. Make sure you discuss any questions you   have with your health care provider. Document Revised: 03/21/2020 Document Reviewed: 05/17/2019 Elsevier Patient Education  2021 Elsevier Inc.     Drospirenone; Ethinyl Estradiol tablets What is this medicine? DROSPIRENONE; ETHINYL ESTRADIOL (dro SPY re nown; ETH in il es tra DYE ole) is an oral contraceptive (birth control pill). This medicine combines two types of female  hormones, an estrogen and a progestin. It is used to prevent ovulation and pregnancy. This medicine may be used for other purposes; ask your health care provider or pharmacist if you have questions. COMMON BRAND NAME(S): Ovidio Hanger, Lo-Zumandimine, Elmer Ramp 28-Day, Ocella, Syeda, Vestura, Alphonse Guild, Zumandimine What should I tell my health care provider before I take this medicine? They need to know if you have or ever had any of these conditions:  abnormal vaginal bleeding  adrenal gland disease  blood vessel disease or blood clots  breast, cervical, endometrial, ovarian, liver, or uterine cancer  diabetes  gallbladder disease  heart disease or recent heart attack  high blood pressure  high cholesterol  high potassium level  kidney disease  liver disease  migraine headaches  stroke  systemic lupus erythematosus (SLE)  tobacco smoker  an unusual or allergic reaction to estrogens, progestins, or other medicines, foods, dyes, or preservatives  pregnant or trying to get pregnant  breast-feeding How should I use this medicine? Take this medicine by mouth. To reduce nausea, this medicine may be taken with food. Follow the directions on the prescription label. Take this medicine at the same time each day and in the order directed on the package. Do not take your medicine more often than directed. A patient package insert for the product will be given with each prescription and refill. Read this sheet carefully each time. The sheet may change frequently. Talk to your pediatrician regarding the use of this medicine in children. Special care may be needed. This medicine has been used in female children who have started having menstrual periods. Overdosage: If you think you have taken too much of this medicine contact a poison control center or emergency room at once. NOTE: This medicine is only for you. Do not share this medicine with others. What if I miss  a dose? If you miss a dose, refer to the patient information sheet you received with your medicine for direction. If you miss more than one pill, this medicine may not be as effective and you may need to use another form of birth control. What may interact with this medicine? Do not take this medicine with any of the following medications:  aminoglutethimide  amprenavir, fosamprenavir  atazanavir; cobicistat  anastrozole  bosentan  exemestane  letrozole  metyrapone  testolactone This medicine may also interact with the following medications:  acetaminophen  antiviral medicines for HIV or AIDS  aprepitant  barbiturates  certain antibiotics like rifampin, rifabutin, rifapentine, and possibly penicillins or tetracyclines  certain diuretics like amiloride, spironolactone, triamterene  certain medicines for fungal infections like griseofulvin, ketoconazole, itraconazole  certain medications for high blood pressure or heart conditions like ACE-inhibitors, Angiotensin-II receptor blockers, eplerenone  certain medicines for seizures like carbamazepine, oxcarbazepine, phenobarbital, phenytoin  cholestyramine  cobicistat  corticosteroid like hydrocortisone and prednisolone  cyclosporine  dantrolene  felbamate  grapefruit juice  heparin  lamotrigine  medicines for diabetes, including pioglitazone  modafinil  NSAIDs  potassium supplements  pyrimethamine  raloxifene  St. John's wort  sulfasalazine  tamoxifen  topiramate  thyroid hormones  warfarin his list may not describe all possible  interactions. Give your health care provider a list of all the medicines, herbs, non-prescription drugs, or dietary supplements you use. Also tell them if you smoke, drink alcohol, or use illegal drugs. Some items may interact with your medicine. This list may not describe all possible interactions. Give your health care provider a list of all the medicines, herbs,  non-prescription drugs, or dietary supplements you use. Also tell them if you smoke, drink alcohol, or use illegal drugs. Some items may interact with your medicine. What should I watch for while using this medicine? Visit your doctor or health care professional for regular checks on your progress. You will need a regular breast and pelvic exam and Pap smear while on this medicine. Use an additional method of contraception during the first cycle that you take these tablets. If you have any reason to think you are pregnant, stop taking this medicine right away and contact your doctor or health care professional. If you are taking this medicine for hormone related problems, it may take several cycles of use to see improvement in your condition. Smoking increases the risk of getting a blood clot or having a stroke while you are taking birth control pills, especially if you are more than 23 years old. You are strongly advised not to smoke. This medicine can make your body retain fluid, making your fingers, hands, or ankles swell. Your blood pressure can go up. Contact your doctor or health care professional if you feel you are retaining fluid. This medicine can make you more sensitive to the sun. Keep out of the sun. If you cannot avoid being in the sun, wear protective clothing and use sunscreen. Do not use sun lamps or tanning beds/booths. If you wear contact lenses and notice visual changes, or if the lenses begin to feel uncomfortable, consult your eye care specialist. In some women, tenderness, swelling, or minor bleeding of the gums may occur. Notify your dentist if this happens. Brushing and flossing your teeth regularly may help limit this. See your dentist regularly and inform your dentist of the medicines you are taking. If you are going to have elective surgery, you may need to stop taking this medicine before the surgery. Consult your health care professional for advice. This medicine does not  protect you against HIV infection (AIDS) or any other sexually transmitted diseases. What side effects may I notice from receiving this medicine? Side effects that you should report to your doctor or health care professional as soon as possible:  allergic reactions like skin rash, itching or hives, swelling of the face, lips, or tongue  breast tissue changes or discharge  changes in vision  chest pain  confusion, trouble speaking or understanding  dark urine  general ill feeling or flu-like symptoms  light-colored stools  nausea, vomiting  pain, swelling, warmth in the leg  right upper belly pain  severe headaches  shortness of breath  sudden numbness or weakness of the face, arm or leg  trouble walking, dizziness, loss of balance or coordination  unusual vaginal bleeding  yellowing of the eyes or skin Side effects that usually do not require medical attention (report to your doctor or health care professional if they continue or are bothersome):  acne  brown spots on the face  change in appetite  change in sexual desire  depressed mood or mood swings  fluid retention and swelling  stomach cramps or bloating  unusually weak or tired  weight gain This list may not describe all  possible side effects. Call your doctor for medical advice about side effects. You may report side effects to FDA at 1-800-FDA-1088. Where should I keep my medicine? Keep out of the reach of children. Store at room temperature between 15 and 30 degrees C (59 and 86 degrees F). Throw away any unused medicine after the expiration date. NOTE: This sheet is a summary. It may not cover all possible information. If you have questions about this medicine, talk to your doctor, pharmacist, or health care provider.  2021 Elsevier/Gold Standard (2016-04-04 13:52:56)

## 2020-10-19 NOTE — Progress Notes (Signed)
Assessment and Plan:  Natasha Woods was seen today for menorrhagia.  Diagnoses and all orders for this visit:  Abnormal uterine bleeding 22 days with likely symptomatic anemia; unclear etiology; check labs Has started on iron supplement; check levels Has been referred to GYN - requested urgent evaluation next week  UPT negative; Will start high dose estrogen HBC agent today after discussion Follow up if any change in sx -     CBC with Differential/Platelet -     TSH -     Iron, TIBC and Ferritin Panel -     Protime-INR -     POCT urine pregnancy  Dizziness/ Fatigue, unspecified type Suspect for symptomatic anemia secondary to atypical uterine bleeding Has started on iron supplement; check CBC, iron/ferritin for baseline -     CBC with Differential/Platelet -     Iron, TIBC and Ferritin Panel  Menorrhagia with regular cycle Typically fairly managed by ibuprofen, atypical bleeding, see above workup, also has been referred to GYN and will try to get her in quickly early next week for further workup but discussed hormonal therapy with high estrogen agent in the short term. No personal or family hx of clots, no plans for long trip/air travel, non-smoker.  -     drospirenone-ethinyl estradiol (YASMIN 28) 3-0.03 MG tablet; Take 1 tablet by mouth daily.  Further disposition pending results of labs. Discussed med's effects and SE's.   Over 30 minutes of exam, counseling, chart review, and critical decision making was performed.   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   She reports started menstrual cycle 09/27/2020, (typically has 5 days of heavy flow then 1-2 days of light, regularly cycles with cramping that is managed by ibuprofen), reports has been heavy flow for 22 days. She reports soaking through maxi pad every 4 hours. Bleeding started to lighten up 4-5 days ago, but back to heavy flow in the last 3 days. She notes pain/cramping was less than usual this cycle, only took ibuprofen first 3 days. Denies atypical abdominal pain, bloating, nausea, fever/chills, vaginal odor or discharge other than heavy typical menstrual discharge.   She was feeling dizzy and weak, has been taking OTC iron supplement (unsure of dose) for 4 days and pushing spinach intake. Reports feeling dizzy if standing for a period of time and has been sitting to teach. Denies dyspnea, racing heart beat.   Referral to GYN for evaluation due to never sexually active and need for initial PAP had been placed but she reports hasn't heard from them. She maintains has never been sexually active.    Past Medical History:  Diagnosis Date  . Allergic rhinitis 07/19/2018     Allergies  Allergen Reactions  . Amoxicillin Hives    Current Outpatient Medications on File Prior to Visit  Medication Sig  . albuterol (VENTOLIN HFA) 108 (90 Base) MCG/ACT inhaler Inhale 2 puffs into the lungs every 4 (four) hours as needed for wheezing or shortness of breath.  . cetirizine (ZYRTEC) 10 MG tablet Take 10 mg  by mouth daily.  . fluticasone (FLONASE) 50 MCG/ACT nasal spray Place 2 sprays into both nostrils at bedtime.  Marland Kitchen ibuprofen (ADVIL) 800 MG tablet Take 1 tablet (800 mg total) by mouth every 8 (eight) hours as needed for cramping.  . Multiple Vitamins-Minerals (MULTIVITAMIN PO) Take by mouth daily.   No current facility-administered medications on file prior to visit.   Allergies:  Allergies  Allergen  Reactions  . Amoxicillin Hives   Medical History:  has Allergic rhinitis; Menorrhagia with regular cycle; Vitamin D deficiency; Medication management; and B12 deficiency on their problem list. Surgical History:  She  has a past surgical history that includes Wisdom tooth extraction (2016). Family History:  Herfamily history includes Cancer in her maternal grandfather and paternal grandfather; Dementia in her paternal grandmother; Hashimoto's thyroiditis in her sister; Irritable bowel syndrome in her maternal grandmother and paternal grandmother; Skin cancer in her maternal grandfather; Thyroid disease in her mother. Social History:   reports that she has never smoked. She has never used smokeless tobacco. She reports current alcohol use. She reports that she does not use drugs.   ROS: all negative except above.   Physical Exam:  BP 118/72   Pulse 98   Temp 98.1 F (36.7 C)   Wt 132 lb (59.9 kg)   SpO2 99%   BMI 21.97 kg/m   General Appearance: Well nourished, well dressed young adult female, appears pale but in no acute distress. Eyes: PERRLA, EOMs, conjunctiva no swelling or erythema Sinuses: No Frontal/maxillary tenderness ENT/Mouth:  No erythema, swelling, or exudate on post pharynx.  Tonsils not swollen or erythematous. Hearing normal. MM pale.  Neck: Supple, thyroid normal.  Respiratory: Respiratory effort normal, BS equal bilaterally without rales, rhonchi, wheezing or stridor.  Cardio: RRR with no MRGs. Brisk peripheral pulses without edema.  Abdomen: Soft, non-distended/bloated, + BS.  Non tender, no guarding, rebound, hernias, masses. Lymphatics: Non tender without lymphadenopathy.  Musculoskeletal: normal gait.  Skin: Warm, dry without rashes, lesions, ecchymosis.  Neuro: Normal muscle tone, no cerebellar symptoms. Sensation intact.  Psych: Awake and oriented X 3, normal affect, Insight and Judgment appropriate.     Dan Maker, NP 1:31 PM [facility]

## 2020-10-20 ENCOUNTER — Encounter: Payer: Self-pay | Admitting: Adult Health

## 2020-10-20 DIAGNOSIS — N939 Abnormal uterine and vaginal bleeding, unspecified: Secondary | ICD-10-CM | POA: Insufficient documentation

## 2020-10-20 DIAGNOSIS — D62 Acute posthemorrhagic anemia: Secondary | ICD-10-CM | POA: Insufficient documentation

## 2020-10-20 LAB — CBC WITH DIFFERENTIAL/PLATELET
Absolute Monocytes: 409 cells/uL (ref 200–950)
Basophils Absolute: 41 cells/uL (ref 0–200)
Basophils Relative: 0.9 %
Eosinophils Absolute: 69 cells/uL (ref 15–500)
Eosinophils Relative: 1.5 %
HCT: 30.3 % — ABNORMAL LOW (ref 35.0–45.0)
Hemoglobin: 9.6 g/dL — ABNORMAL LOW (ref 11.7–15.5)
Lymphs Abs: 2065 cells/uL (ref 850–3900)
MCH: 26.7 pg — ABNORMAL LOW (ref 27.0–33.0)
MCHC: 31.7 g/dL — ABNORMAL LOW (ref 32.0–36.0)
MCV: 84.4 fL (ref 80.0–100.0)
MPV: 9.3 fL (ref 7.5–12.5)
Monocytes Relative: 8.9 %
Neutro Abs: 2015 cells/uL (ref 1500–7800)
Neutrophils Relative %: 43.8 %
Platelets: 452 10*3/uL — ABNORMAL HIGH (ref 140–400)
RBC: 3.59 10*6/uL — ABNORMAL LOW (ref 3.80–5.10)
RDW: 14 % (ref 11.0–15.0)
Total Lymphocyte: 44.9 %
WBC: 4.6 10*3/uL (ref 3.8–10.8)

## 2020-10-20 LAB — PROTIME-INR
INR: 1
Prothrombin Time: 9.9 s (ref 9.0–11.5)

## 2020-10-20 LAB — IRON,TIBC AND FERRITIN PANEL
%SAT: 3 % (calc) — ABNORMAL LOW (ref 16–45)
Ferritin: 1 ng/mL — ABNORMAL LOW (ref 16–154)
Iron: 12 ug/dL — ABNORMAL LOW (ref 40–190)
TIBC: 404 mcg/dL (calc) (ref 250–450)

## 2020-10-20 LAB — TSH: TSH: 1.58 mIU/L

## 2020-10-22 ENCOUNTER — Ambulatory Visit: Payer: BC Managed Care – PPO | Admitting: Obstetrics & Gynecology

## 2020-10-22 ENCOUNTER — Other Ambulatory Visit: Payer: Self-pay

## 2020-10-22 ENCOUNTER — Encounter: Payer: Self-pay | Admitting: Obstetrics & Gynecology

## 2020-10-22 VITALS — BP 122/74 | Ht 64.5 in | Wt 134.0 lb

## 2020-10-22 DIAGNOSIS — N921 Excessive and frequent menstruation with irregular cycle: Secondary | ICD-10-CM

## 2020-10-22 DIAGNOSIS — D649 Anemia, unspecified: Secondary | ICD-10-CM

## 2020-10-22 NOTE — Progress Notes (Signed)
    Natasha Woods [birth year] [id number]        25 y.o.  G0  Single.  Second grade teacher.  RP: Menometrorrhagia with secondary anemia  HPI: Heavy menses usually every month x 5-6 days.  This LMP has been heavier and has now been going on for 3 weeks.  Hb 9.6 on 3/25th.  Started on the generic of Yasmin on 3/25th by Fam MD, now very mild vaginal bleeding.  Feels tired and SOB easily when exercising.  Iron-rich diet and Iron supplements since 3/25th.  No pelvic pain.  Virgin.   OB History  Gravida Para Term Preterm AB Living  0 0 0 0 0 0  SAB IAB Ectopic Multiple Live Births  0 0 0 0 0    Past medical history,surgical history, problem list, medications, allergies, family history and social history were all reviewed and documented in the EPIC chart.   Directed ROS with pertinent positives and negatives documented in the history of present illness/assessment and plan.  Exam:  Vitals:   10/22/20 1029  BP: 122/74  Weight: 134 lb (60.8 kg)  Height: 5' 4.5" (1.638 m)   General appearance:  Normal  Abdomen: Soft, NT, no mass felt.  Gynecologic exam: Deferred as patient is a virgin.   Assessment/Plan:  25 y.o. G0  1. Menometrorrhagia Counseling on heavy menstrual periods done.  Longstanding history of heavy menstrual flow, but periods were regular every month lasting 5 to 6 days.  Last menstrual period was heavier and continued for 3 weeks.  Seen by family physician on March 25 when hemoglobin was found to be at 9.6.  Patient started on the generic of Yasmin and iron supplements.  Light menstrual flow currently.  Recommend doubling her birth control pills until the bleeding stops completely.  Continuous use of birth control pills reviewed with patient.  No contraindication to birth control pills.  Will investigate the abnormal bleeding further with a pelvic ultrasound at follow-up. - US Pelvis Complete; Future  2. Secondary anemia Recommend iron sulfate 325 mg 3 times a day.   Continue on the generic of Yasmin to control bleeding.  Will double the birth control pills until the bleeding stops.  Genia Del MD, 10:52 AM 10/22/2020

## 2020-11-13 ENCOUNTER — Other Ambulatory Visit: Payer: Self-pay | Admitting: Nurse Practitioner

## 2020-11-13 DIAGNOSIS — N939 Abnormal uterine and vaginal bleeding, unspecified: Secondary | ICD-10-CM

## 2020-11-13 MED ORDER — MEGESTROL ACETATE 40 MG PO TABS
40.0000 mg | ORAL_TABLET | Freq: Every day | ORAL | 0 refills | Status: DC
Start: 2020-11-13 — End: 2020-11-15

## 2020-11-14 ENCOUNTER — Encounter (HOSPITAL_BASED_OUTPATIENT_CLINIC_OR_DEPARTMENT_OTHER): Payer: Self-pay

## 2020-11-14 ENCOUNTER — Other Ambulatory Visit: Payer: Self-pay

## 2020-11-14 ENCOUNTER — Telehealth: Payer: Self-pay | Admitting: *Deleted

## 2020-11-14 ENCOUNTER — Emergency Department (HOSPITAL_BASED_OUTPATIENT_CLINIC_OR_DEPARTMENT_OTHER)
Admission: EM | Admit: 2020-11-14 | Discharge: 2020-11-15 | Disposition: A | Discharge status: Home / Self Care | Payer: BC Managed Care – PPO | Attending: Emergency Medicine | Admitting: Emergency Medicine

## 2020-11-14 ENCOUNTER — Ambulatory Visit
Admission: EM | Admit: 2020-11-14 | Discharge: 2020-11-14 | Disposition: A | Discharge status: Home / Self Care | Payer: BC Managed Care – PPO | Attending: Emergency Medicine | Admitting: Emergency Medicine

## 2020-11-14 DIAGNOSIS — R1030 Lower abdominal pain, unspecified: Secondary | ICD-10-CM

## 2020-11-14 DIAGNOSIS — R102 Pelvic and perineal pain: Secondary | ICD-10-CM | POA: Diagnosis not present

## 2020-11-14 DIAGNOSIS — N946 Dysmenorrhea, unspecified: Secondary | ICD-10-CM | POA: Diagnosis not present

## 2020-11-14 DIAGNOSIS — E876 Hypokalemia: Secondary | ICD-10-CM | POA: Diagnosis not present

## 2020-11-14 DIAGNOSIS — D72829 Elevated white blood cell count, unspecified: Secondary | ICD-10-CM | POA: Diagnosis not present

## 2020-11-14 DIAGNOSIS — R1031 Right lower quadrant pain: Secondary | ICD-10-CM | POA: Diagnosis present

## 2020-11-14 DIAGNOSIS — N939 Abnormal uterine and vaginal bleeding, unspecified: Secondary | ICD-10-CM

## 2020-11-14 MED ORDER — FENTANYL CITRATE (PF) 100 MCG/2ML IJ SOLN
50.0000 ug | Freq: Once | INTRAMUSCULAR | Status: AC
  Administered 2020-11-15: 50 ug via INTRAVENOUS
  Filled 2020-11-14: qty 2

## 2020-11-14 MED ORDER — TIZANIDINE HCL 2 MG PO TABS: 2.0000 mg | ORAL_TABLET | Freq: Four times a day (QID) | ORAL | 0 refills | Status: DC | PRN

## 2020-11-14 MED ORDER — KETOROLAC TROMETHAMINE 30 MG/ML IJ SOLN
30.0000 mg | Freq: Once | INTRAMUSCULAR | Status: AC
Start: 2020-11-14 — End: 2020-11-14
  Administered 2020-11-14: 30 mg via INTRAMUSCULAR

## 2020-11-14 MED ORDER — NAPROXEN 500 MG PO TABS: 500.0000 mg | ORAL_TABLET | Freq: Two times a day (BID) | ORAL | 0 refills | Status: DC

## 2020-11-14 MED ORDER — SODIUM CHLORIDE 0.9 % IV BOLUS
1000.0000 mL | Freq: Once | INTRAVENOUS | Status: AC
  Administered 2020-11-15: 1000 mL via INTRAVENOUS

## 2020-11-14 MED ORDER — ACETAMINOPHEN 500 MG PO TABS
1000.0000 mg | ORAL_TABLET | Freq: Once | ORAL | Status: AC
  Administered 2020-11-15: 1000 mg via ORAL
  Filled 2020-11-14: qty 2

## 2020-11-14 MED ORDER — TRAMADOL HCL 50 MG PO TABS: 50.0000 mg | ORAL_TABLET | Freq: Four times a day (QID) | ORAL | 0 refills | Status: DC | PRN

## 2020-11-14 NOTE — Telephone Encounter (Signed)
If patient is having a lot of cramping on her birth control pills, she may stop them and do just take the Megace 40 mg po bid.  This usually works to help stop bleeding.   The Megace is not considered pregnancy prevention, and she should not get pregnancy while taking this medication.   Have her keep her appointment for next week.

## 2020-11-14 NOTE — ED Provider Notes (Signed)
[facility] EMERGENCY DEPT Provider Note  CSN: [id number] Arrival date & time: 11/14/20 2259  Chief Complaint(s) Abdominal Pain, Chest Pain, and Leg Pain  HPI Natasha Woods is a 25 y.o. female currently being treated for menorrhagia, here for persistent and worsening lower abdominal pain mostly on the right which radiates up to her chest and down into her pelvis.  Pain worse with movement and palpation.  No alleviating factors. Patient denies any vaginal discharge.  Denies being sexually active. No nausea or vomiting.  No diarrhea.  HPI  Past Medical History Past Medical History:  Diagnosis Date  . Allergic rhinitis 07/19/2018   Patient Active Problem List   Diagnosis Date Noted  . Abnormal vaginal bleeding 10/20/2020  . Acute blood loss anemia 10/20/2020  . B12 deficiency 02/16/2020  . Menorrhagia with regular cycle 11/29/2018  . Vitamin D deficiency 11/29/2018  . Medication management 11/29/2018  . Allergic rhinitis 07/19/2018   Home Medication(s) Prior to Admission medications   Medication Sig Start Date End Date Taking? Authorizing Provider  albuterol (VENTOLIN HFA) 108 (90 Base) MCG/ACT inhaler Inhale 2 puffs into the lungs every 4 (four) hours as needed for wheezing or shortness of breath. 12/03/16   Doree Albee, PA-C  cetirizine (ZYRTEC) 10 MG tablet Take 10 mg by mouth daily.    [provider]  drospirenone-ethinyl estradiol (YASMIN 28) 3-0.03 MG tablet Take 1 tablet by mouth daily. 10/19/20   Judd Gaudier, NP  fluticasone (FLONASE) 50 MCG/ACT nasal spray Place 2 sprays into both nostrils at bedtime. 12/03/16   Doree Albee, PA-C  ibuprofen (ADVIL) 800 MG tablet Take 1 tablet (800 mg total) by mouth every 8 (eight) hours as needed for cramping. 11/29/18   Doree Albee, PA-C  megestrol (MEGACE) 40 MG tablet Take 1 tablet (40 mg total) by mouth daily for 10 days. 11/13/20 11/23/20  Olivia Mackie, NP  Multiple Vitamins-Minerals  (MULTIVITAMIN PO) Take by mouth daily.    [provider]  naproxen (NAPROSYN) 500 MG tablet Take 1 tablet (500 mg total) by mouth 2 (two) times daily. 11/14/20   Wieters, Hallie C, PA-C  tiZANidine (ZANAFLEX) 2 MG tablet Take 1-2 tablets (2-4 mg total) by mouth every 6 (six) hours as needed for muscle spasms. 11/14/20   Wieters, Hallie C, PA-C  traMADol (ULTRAM) 50 MG tablet Take 1 tablet (50 mg total) by mouth every 6 (six) hours as needed for severe pain. 11/14/20   Wieters, Junius Creamer, PA-C                                                                                                                                    Past Surgical History Past Surgical History:  Procedure Laterality Date  . WISDOM TOOTH EXTRACTION  2016   Family History Family History  Problem Relation Age of Onset  . Thyroid disease Mother   . Cancer Maternal Grandfather  prostate  . Skin cancer Maternal Grandfather   . Cancer Paternal Grandfather        brain  . Hashimoto's thyroiditis Sister   . Irritable bowel syndrome Maternal Grandmother   . Dementia Paternal Grandmother   . Irritable bowel syndrome Paternal Grandmother     Social History Social History   Tobacco Use  . Smoking status: Never Smoker  . Smokeless tobacco: Never Used  Vaping Use  . Vaping Use: Never used  Substance Use Topics  . Alcohol use: Yes    Comment: Socially  . Drug use: No   Allergies Amoxicillin  Review of Systems Review of Systems All other systems are reviewed and are negative for acute change except as noted in the HPI  Physical Exam Vital Signs  I have reviewed the triage vital signs BP 117/68 (BP Location: Left Arm)   Pulse 90   Temp 98.8 F (37.1 C) (Oral)   Resp 16   Ht  (1.626 m)   Wt 60.3 kg   SpO2 100%   BMI 22.83 kg/m   Physical Exam Vitals reviewed. Exam conducted with a chaperone present.  Constitutional:      General: She is not in acute distress.    Appearance: She is  well-developed. She is not diaphoretic.  HENT:     Head: Normocephalic and atraumatic.     Right Ear: External ear normal.     Left Ear: External ear normal.     Nose: Nose normal.  Eyes:     General: No scleral icterus.    Conjunctiva/sclera: Conjunctivae normal.  Neck:     Trachea: Phonation normal.  Cardiovascular:     Rate and Rhythm: Normal rate and regular rhythm.  Pulmonary:     Effort: Pulmonary effort is normal. No respiratory distress.     Breath sounds: No stridor.  Abdominal:     General: There is no distension.     Tenderness: There is abdominal tenderness in the right lower quadrant and suprapubic area. There is guarding. There is no rebound. Negative signs include Murphy's sign, Rovsing's sign, McBurney's sign, psoas sign and obturator sign.  Genitourinary:    Vagina: No vaginal discharge, erythema, tenderness, bleeding or lesions.     Cervix: Cervical bleeding present. No cervical motion tenderness, discharge, friability, lesion or erythema.     Adnexa:        Right: Tenderness present.        Left: Tenderness present.   Musculoskeletal:        General: Normal range of motion.     Cervical back: Normal range of motion.  Neurological:     Mental Status: She is alert and oriented to person, place, and time.  Psychiatric:        Behavior: Behavior normal.     ED Results and Treatments Labs (all labs ordered are listed, but only abnormal results are displayed) Labs Reviewed  WET PREP, GENITAL - Abnormal; Notable for the following components:      Result Value   WBC, Wet Prep HPF POC MODERATE (*)    All other components within normal limits  CBC WITH DIFFERENTIAL/PLATELET - Abnormal; Notable for the following components:   WBC 11.4 (*)    RBC 3.55 (*)    Hemoglobin 9.6 (*)    HCT 29.6 (*)    Neutro Abs 8.9 (*)    All other components within normal limits  COMPREHENSIVE METABOLIC PANEL - Abnormal; Notable for the following components:   Potassium  3.2 (*)     CO2 21 (*)    Calcium 8.7 (*)    Total Protein 6.3 (*)    AST 12 (*)    Total Bilirubin 0.2 (*)    All other components within normal limits  URINALYSIS, ROUTINE W REFLEX MICROSCOPIC - Abnormal; Notable for the following components:   Specific Gravity, Urine >1.046 (*)    Hgb urine dipstick LARGE (*)    Ketones, ur TRACE (*)    Protein, ur 30 (*)    All other components within normal limits  LIPASE, BLOOD  HCG, QUANTITATIVE, PREGNANCY                                                                                                                         EKG  EKG Interpretation  Date/Time:    Ventricular Rate:    PR Interval:    QRS Duration:   QT Interval:    QTC Calculation:   R Axis:     Text Interpretation:        Radiology CT ABDOMEN PELVIS W CONTRAST  Result Date: 11/15/2020 CLINICAL DATA:  Right lower quadrant abdominal pain EXAM: CT ABDOMEN AND PELVIS WITH CONTRAST TECHNIQUE: Multidetector CT imaging of the abdomen and pelvis was performed using the standard protocol following bolus administration of intravenous contrast. CONTRAST:  OMNIPAQUE IOHEXOL 300 MG/ML  SOLN COMPARISON:  Contemporary pelvic ultrasound FINDINGS: Lower chest: Tiny 3 mm subpleural nodule in the left lower lobe. Lung bases are clear. Normal heart size. No pericardial effusion. Hepatobiliary: No worrisome focal liver abnormality is seen. Normal gallbladder. No visible calcified gallstones. No biliary ductal dilatation. Pancreas: No pancreatic ductal dilatation or surrounding inflammatory changes. Spleen: Normal in size. No concerning splenic lesions. Adrenals/Urinary Tract: Normal adrenals. Kidneys are normally located with symmetric enhancement. No suspicious renal lesion, urolithiasis or hydronephrosis. Urinary bladder is largely decompressed at the time of exam and therefore poorly evaluated by CT imaging. Mild bladder wall thickening is nonspecific given underdistention. Stomach/Bowel: Distal  esophagus, stomach and duodenum are unremarkable. No small bowel thickening or dilatation. Cecum is displaced towards the low pelvis. Normal appearing appendix courses in a retrocecal position superiorly from the cecal tip. No focal periappendiceal inflammation. No colonic dilatation or wall thickening. Vascular/Lymphatic: No significant vascular findings are present. No enlarged abdominal or pelvic lymph nodes. Reproductive: Retroverted uterus with marked endometrial thickening. Better assessed on comparison pelvic ultrasound. No concerning adnexal lesion. Other: Small volume simple attenuation free fluid in the deep pelvis. Nonspecific and often physiologic in a reproductive age female. Musculoskeletal: No acute osseous abnormality or suspicious osseous lesion. IMPRESSION: 1. Normal appendix. 2. Retroverted uterus with marked endometrial thickening. Better assessed on comparison pelvic ultrasound. 3. Small volume simple attenuation free fluid in the deep pelvis, nonspecific and often physiologic in a reproductive age female. 4. Mild bladder wall thickening can be secondary to underdistention. Correlate with urinalysis to exclude cystitis. 5. Tiny 3 mm subpleural nodule in the left lower lobe. Most  consistent with a benign subpleural lymph node or postinflammatory finding in a patient of this age. Electronically Signed   By: Kreg Shropshire M.D.   On: 11/15/2020 01:38   Pelvic Ultrasound: IMPRESSION: Retroverted uterus.  Marked, heterogeneous endometrial thickening. Given that bleeding remains unresponsive to hormonal/medical therapy, sonohysterogram should be considered for focal lesion work-up. (Ref: Radiological Reasoning: Algorithmic Workup of Abnormal Vaginal Bleeding with Endovaginal Sonography and Sonohysterography. AJR 2008; 782:N56-21)  Moderate volume free fluid in the deep pelvis with some demonstrable echogenic debris, nonspecific though possibly related to a collapsing cyst/follicle seen in  the right ovary.   Pertinent labs & imaging results that were available during my care of the patient were reviewed by me and considered in my medical decision making (see chart for details).  Medications Ordered in ED Medications  acetaminophen (TYLENOL) tablet 1,000 mg (1,000 mg Oral Given 11/15/20 0007)  fentaNYL (SUBLIMAZE) injection 50 mcg (50 mcg Intravenous Given 11/15/20 0006)  sodium chloride 0.9 % bolus 1,000 mL (0 mLs Intravenous Stopped 11/15/20 0330)  iohexol (OMNIPAQUE) 300 MG/ML solution 100 mL (100 mLs Intravenous Contrast Given 11/15/20 0123)  HYDROmorphone (DILAUDID) injection 0.5 mg (0.5 mg Intravenous Given 11/15/20 0146)  ondansetron (ZOFRAN) injection 4 mg (4 mg Intravenous Given 11/15/20 0227)  ondansetron (ZOFRAN) injection 4 mg (4 mg Intravenous Given 11/15/20 0325)                                                                                                                                    Procedures Procedures  (including critical care time)  Medical Decision Making / ED Course I have reviewed the nursing notes for this encounter and the patient's prior records (if available in EHR or on provided paperwork).   Kourtlyn Charlet was evaluated in Emergency Department on 11/15/2020 for the symptoms described in the history of present illness. She was evaluated in the context of the global COVID-19 pandemic, which necessitated consideration that the patient might be at risk for infection with the SARS-CoV-2 virus that causes COVID-19. Institutional protocols and algorithms that pertain to the evaluation of patients at risk for COVID-19 are in a state of rapid change based on information released by regulatory bodies including the CDC and federal and state organizations. These policies and algorithms were followed during the patient's care in the ED.  Persistent abd pain in the setting of AUB. Mostly RLQ TTP Pelvic exam w/o mass, or evidence of cervicitis. Labs notable  for leukocytosis.  Hemoglobin stable. Mild hypokalemia otherwise no significant electrolyte derangements or renal sufficiency. No evidence of bili obstruction or pancreatitis. Wet prep without evidence of bacterial vaginosis. The patient denies any prior sexual activity ever, thus GC/chlamydia was not sent. Given the right lower quadrant abdominal tenderness with leukocytosis, CT scan obtained and negative for appendicitis. Pelvic ultrasound notable for right ovarian follicle.  No torsion.  Notable for marked endometrial thickening.  UA without evidence of infection.  Patient already scheduled for Ob/Gyn follow-up      Final Clinical Impression(s) / ED Diagnoses Final diagnoses:  Pelvic pain    The patient appears reasonably screened and/or stabilized for discharge and I doubt any other medical condition or other [facility] requiring further screening, evaluation, or treatment in the ED at this time prior to discharge. Safe for discharge with strict return precautions.  Disposition: Discharge  Condition: Good  I have discussed the results, Dx and Tx plan with the patient/family who expressed understanding and agree(s) with the plan. Discharge instructions discussed at length. The patient/family was given strict return precautions who verbalized understanding of the instructions. No further questions at time of discharge.    ED Discharge Orders    None      Follow Up: OB/Gyn  Go to  as scheduled  Lucky Cowboy, MD [address] [place] [address] [phone]  Call  as needed     This chart was dictated using voice recognition software.  Despite best efforts to proofread,  errors can occur which can change the documentation meaning.   Nira Conn, MD 11/15/20 0400

## 2020-11-14 NOTE — ED Triage Notes (Signed)
Patient here POV from Home with Mult. Complaints.  Patient had a very Heavy Period 6 weeks PTA that last 4 weeks (no clots but very heavy).   ABD Cramps then began shortly after Period ended.   Patient was placed on Hormonal Therapy to lessen bleeding.   Patient here because 3 days PTA Patient began having ABD Pain that is radiating to Mid Chest and Lower Extremities.   No N/V/D. Ambulatory.

## 2020-11-14 NOTE — ED Provider Notes (Signed)
[name]    CSN: [id number] Arrival date & time: 11/14/20  1093      History   Chief Complaint Chief Complaint  Patient presents with  . Abdominal Cramping    HPI Natasha Woods is a 25 y.o. female history of menorrhagia, vitamin D deficiency presents today for evaluation of cramps.  Has history of abnormal uterine bleeding and is working with OB/GYN to obtain better control of this.  Frequently will have heavy cycles which is led to anemia in the past.  Reports starting cycle last week which was heavy, initially increased birth control tablets to 2 tablets daily, OB/GYN recommended returning to once daily and starting Megace.  Has taken 2 doses of Megace.  Reports cramping began after starting birth control.  Pain in bilateral lower abdomen and radiating upwards.  Reports normal oral intake.  Denies urinary symptoms.  Denies other vaginal symptoms or concerns for STDs.  Denies nausea vomiting. using ibuprofen and Tylenol without relief.  HPI  Past Medical History:  Diagnosis Date  . Allergic rhinitis 07/19/2018    Patient Active Problem List   Diagnosis Date Noted  . Abnormal vaginal bleeding 10/20/2020  . Acute blood loss anemia 10/20/2020  . B12 deficiency 02/16/2020  . Menorrhagia with regular cycle 11/29/2018  . Vitamin D deficiency 11/29/2018  . Medication management 11/29/2018  . Allergic rhinitis 07/19/2018    Past Surgical History:  Procedure Laterality Date  . WISDOM TOOTH EXTRACTION  2016    OB History    Gravida  0   Para  0   Term  0   Preterm  0   AB  0   Living  0     SAB  0   IAB  0   Ectopic  0   Multiple  0   Live Births  0            Home Medications    Prior to Admission medications   Medication Sig Start Date End Date Taking? Authorizing Provider  naproxen (NAPROSYN) 500 MG tablet Take 1 tablet (500 mg total) by mouth 2 (two) times daily. 11/14/20  Yes Jacksyn Beeks C, PA-C  tiZANidine (ZANAFLEX) 2 MG  tablet Take 1-2 tablets (2-4 mg total) by mouth every 6 (six) hours as needed for muscle spasms. 11/14/20  Yes My Rinke C, PA-C  traMADol (ULTRAM) 50 MG tablet Take 1 tablet (50 mg total) by mouth every 6 (six) hours as needed for severe pain. 11/14/20  Yes Ahsha Hinsley C, PA-C  albuterol (VENTOLIN HFA) 108 (90 Base) MCG/ACT inhaler Inhale 2 puffs into the lungs every 4 (four) hours as needed for wheezing or shortness of breath. 12/03/16   Doree Albee, PA-C  cetirizine (ZYRTEC) 10 MG tablet Take 10 mg by mouth daily.    [provider]  drospirenone-ethinyl estradiol (YASMIN 28) 3-0.03 MG tablet Take 1 tablet by mouth daily. 10/19/20   Judd Gaudier, NP  fluticasone (FLONASE) 50 MCG/ACT nasal spray Place 2 sprays into both nostrils at bedtime. 12/03/16   Doree Albee, PA-C  ibuprofen (ADVIL) 800 MG tablet Take 1 tablet (800 mg total) by mouth every 8 (eight) hours as needed for cramping. 11/29/18   Doree Albee, PA-C  megestrol (MEGACE) 40 MG tablet Take 1 tablet (40 mg total) by mouth daily for 10 days. 11/13/20 11/23/20  Olivia Mackie, NP  Multiple Vitamins-Minerals (MULTIVITAMIN PO) Take by mouth daily.    [provider]  Family History Family History  Problem Relation Age of Onset  . Thyroid disease Mother   . Cancer Maternal Grandfather        prostate  . Skin cancer Maternal Grandfather   . Cancer Paternal Grandfather        brain  . Hashimoto's thyroiditis Sister   . Irritable bowel syndrome Maternal Grandmother   . Dementia Paternal Grandmother   . Irritable bowel syndrome Paternal Grandmother     Social History Social History   Tobacco Use  . Smoking status: Never Smoker  . Smokeless tobacco: Never Used  Vaping Use  . Vaping Use: Never used  Substance Use Topics  . Alcohol use: Yes    Comment: rare, every few months   . Drug use: No     Allergies   Amoxicillin   Review of Systems Review of Systems  Constitutional:  Negative for fatigue and fever.  HENT: Negative for mouth sores.   Eyes: Negative for visual disturbance.  Respiratory: Negative for shortness of breath.   Cardiovascular: Negative for chest pain.  Gastrointestinal: Positive for abdominal pain. Negative for nausea and vomiting.  Musculoskeletal: Negative for arthralgias and joint swelling.  Skin: Negative for color change, rash and wound.  Neurological: Negative for dizziness, weakness, light-headedness and headaches.     Physical Exam Triage Vital Signs ED Triage Vitals  Enc Vitals Group     BP      Pulse      Resp      Temp      Temp src      SpO2      Weight      Height      Head Circumference      Peak Flow      Pain Score      Pain Loc      Pain Edu?      Excl. in GC?    No data found.  Updated Vital Signs BP 126/84 (BP Location: Left Arm)   Pulse 90   Temp 98.1 F (36.7 C) (Oral)   Resp 18   SpO2 100%   Visual Acuity Right Eye Distance:   Left Eye Distance:   Bilateral Distance:    Right Eye Near:   Left Eye Near:    Bilateral Near:     Physical Exam Vitals and nursing note reviewed.  Constitutional:      Appearance: She is well-developed.     Comments: No acute distress  HENT:     Head: Normocephalic and atraumatic.     Nose: Nose normal.  Eyes:     Conjunctiva/sclera: Conjunctivae normal.  Cardiovascular:     Rate and Rhythm: Normal rate and regular rhythm.  Pulmonary:     Effort: Pulmonary effort is normal. No respiratory distress.     Comments: Breathing comfortably at rest, CTABL, no wheezing, rales or other adventitious sounds auscultated Abdominal:     General: There is no distension.     Comments: Soft, nondistended, tenderness to palpation in bilateral lower quadrants and suprapubic area, negative rebound, negative Rovsing, negative McBurney's, negative Murphy's  Musculoskeletal:        General: Normal range of motion.     Cervical back: Neck supple.  Skin:    General: Skin is  warm and dry.  Neurological:     Mental Status: She is alert and oriented to person, place, and time.      UC Treatments / Results  Labs (all labs ordered are listed,  but only abnormal results are displayed) Labs Reviewed - No data to display  EKG   Radiology No results found.  Procedures Procedures (including critical care time)  Medications Ordered in UC Medications  ketorolac (TORADOL) 30 MG/ML injection 30 mg (has no administration in time range)    Initial Impression / Assessment and Plan / UC Course  I have reviewed the triage vital signs and the nursing notes.  Pertinent labs & imaging results that were available during my care of the patient were reviewed by me and considered in my medical decision making (see chart for details).     Dysmenorrhea-less likely related to pelvic etiology, suspicious of GI cause or abdominal emergency at this time.  Recommending Toradol, continue with NSAIDs, provided Naprosyn as alternative and along with tizanidine given reported back pain associated with this.  Patient tearful and emotional due to unrelenting pain, did provide a tablets of tramadol to use for severe pain/nighttime pain, discussed using sparingly and associated drowsiness with this.  Follow-up with OB/GYN as planned.  Discussed strict return precautions. Patient verbalized understanding and is agreeable with plan.  Final Clinical Impressions(s) / UC Diagnoses   Final diagnoses:  Dysmenorrhea     Discharge Instructions     We gave you a shot of Toradol Continue with Naprosyn with food twice daily for cramping May try tizanidine to further help with back pain-this is a muscle relaxer, may cause drowsiness, do not drive or work after taking Use tramadol for severe pain/nighttime pain-this may also cause drowsiness, do not drive or work after taking, limit use with tizanidine Please follow-up with OB/GYN as planned Return here or emergency room if symptoms  changing/worsening    ED Prescriptions    Medication Sig Dispense Auth. Provider   naproxen (NAPROSYN) 500 MG tablet Take 1 tablet (500 mg total) by mouth 2 (two) times daily. 30 tablet Wilford Merryfield C, PA-C   tiZANidine (ZANAFLEX) 2 MG tablet Take 1-2 tablets (2-4 mg total) by mouth every 6 (six) hours as needed for muscle spasms. 30 tablet Jamisen Hawes C, PA-C   traMADol (ULTRAM) 50 MG tablet Take 1 tablet (50 mg total) by mouth every 6 (six) hours as needed for severe pain. 8 tablet Earnie Rockhold, Hayden C, PA-C     I have reviewed the PDMP during this encounter.   Lew Dawes, [place] 11/14/20 [phone]

## 2020-11-14 NOTE — Telephone Encounter (Signed)
Patient called scheduled on for pelvic ultrasound on 11/20/20 with Dr.Lavoie. patient called today asking if any sooner appointments for ultrasound. Per appointment desk no sooner appointments, I explained this to patient. She did report to Urgent care and was prescribed pain medication such as ultram 50 mg tablet every 6 hours. I recommended she take ultram as directed and try ibuprofen between to help with pain.  Patient verbalized she understood and will report to ER if pain worsens.

## 2020-11-14 NOTE — Discharge Instructions (Addendum)
We gave you a shot of Toradol Continue with Naprosyn with food twice daily for cramping May try tizanidine to further help with back pain-this is a muscle relaxer, may cause drowsiness, do not drive or work after taking Use tramadol for severe pain/nighttime pain-this may also cause drowsiness, do not drive or work after taking, limit use with tizanidine Please follow-up with OB/GYN as planned Return here or emergency room if symptoms changing/worsening

## 2020-11-14 NOTE — Telephone Encounter (Signed)
Dr.Silva just Lorain Childes Noreene Larsson said I should route this call to you she is Dr.Lavoie patient)

## 2020-11-14 NOTE — ED Triage Notes (Signed)
Pt presents with abdominal cramping after having a 4 week menstrual cycle ; pt has followed up with gynecologist for bleeding and anemia. Pt states cramping is unrelieved with motrin and heating pads.

## 2020-11-15 ENCOUNTER — Emergency Department (HOSPITAL_BASED_OUTPATIENT_CLINIC_OR_DEPARTMENT_OTHER): Payer: BC Managed Care – PPO

## 2020-11-15 ENCOUNTER — Ambulatory Visit: Payer: BC Managed Care – PPO | Admitting: Obstetrics and Gynecology

## 2020-11-15 ENCOUNTER — Encounter (HOSPITAL_BASED_OUTPATIENT_CLINIC_OR_DEPARTMENT_OTHER): Payer: Self-pay

## 2020-11-15 ENCOUNTER — Encounter: Payer: Self-pay | Admitting: Obstetrics and Gynecology

## 2020-11-15 VITALS — BP 100/70 | HR 76 | Ht 64.5 in | Wt 138.0 lb

## 2020-11-15 DIAGNOSIS — N83209 Unspecified ovarian cyst, unspecified side: Secondary | ICD-10-CM | POA: Diagnosis not present

## 2020-11-15 DIAGNOSIS — R102 Pelvic and perineal pain: Secondary | ICD-10-CM | POA: Diagnosis not present

## 2020-11-15 DIAGNOSIS — N939 Abnormal uterine and vaginal bleeding, unspecified: Secondary | ICD-10-CM | POA: Diagnosis not present

## 2020-11-15 LAB — URINALYSIS, ROUTINE W REFLEX MICROSCOPIC
Bilirubin Urine: NEGATIVE
Glucose, UA: NEGATIVE mg/dL
Leukocytes,Ua: NEGATIVE
Nitrite: NEGATIVE
Protein, ur: 30 mg/dL — AB
Specific Gravity, Urine: >1.046 — ABNORMAL HIGH (ref 1.005–1.030)
pH: 6 (ref 5.0–8.0)

## 2020-11-15 LAB — COMPREHENSIVE METABOLIC PANEL
ALT: 10 U/L (ref 0–44)
AST: 12 U/L — ABNORMAL LOW (ref 15–41)
Albumin: 3.5 g/dL (ref 3.5–5.0)
Alkaline Phosphatase: 39 U/L (ref 38–126)
Anion gap: 10 (ref 5–15)
BUN: 9 mg/dL (ref 6–20)
CO2: 21 mmol/L — ABNORMAL LOW (ref 22–32)
Calcium: 8.7 mg/dL — ABNORMAL LOW (ref 8.9–10.3)
Chloride: 107 mmol/L (ref 98–111)
Creatinine, Ser: 0.69 mg/dL (ref 0.44–1.00)
GFR, Estimated: >60 mL/min (ref >60)
Glucose, Bld: 96 mg/dL (ref 70–99)
Potassium: 3.2 mmol/L — ABNORMAL LOW (ref 3.5–5.1)
Sodium: 138 mmol/L (ref 135–145)
Total Bilirubin: 0.2 mg/dL — ABNORMAL LOW (ref 0.3–1.2)
Total Protein: 6.3 g/dL — ABNORMAL LOW (ref 6.5–8.1)

## 2020-11-15 LAB — CBC WITH DIFFERENTIAL/PLATELET
Abs Immature Granulocytes: 0.04 10*3/uL (ref 0.00–0.07)
Basophils Absolute: 0 10*3/uL (ref 0.0–0.1)
Basophils Relative: 0 %
Eosinophils Absolute: 0 10*3/uL (ref 0.0–0.5)
Eosinophils Relative: 0 %
HCT: 29.6 % — ABNORMAL LOW (ref 36.0–46.0)
Hemoglobin: 9.6 g/dL — ABNORMAL LOW (ref 12.0–15.0)
Immature Granulocytes: 0 %
Lymphocytes Relative: 14 %
Lymphs Abs: 1.6 10*3/uL (ref 0.7–4.0)
MCH: 27 pg (ref 26.0–34.0)
MCHC: 32.4 g/dL (ref 30.0–36.0)
MCV: 83.4 fL (ref 80.0–100.0)
Monocytes Absolute: 0.8 10*3/uL (ref 0.1–1.0)
Monocytes Relative: 7 %
Neutro Abs: 8.9 10*3/uL — ABNORMAL HIGH (ref 1.7–7.7)
Neutrophils Relative %: 79 %
Platelets: 317 10*3/uL (ref 150–400)
RBC: 3.55 MIL/uL — ABNORMAL LOW (ref 3.87–5.11)
RDW: 15.3 % (ref 11.5–15.5)
WBC: 11.4 10*3/uL — ABNORMAL HIGH (ref 4.0–10.5)
nRBC: 0 % (ref 0.0–0.2)

## 2020-11-15 LAB — WET PREP, GENITAL
Clue Cells Wet Prep HPF POC: NONE SEEN
Sperm: NONE SEEN
Trich, Wet Prep: NONE SEEN
Yeast Wet Prep HPF POC: NONE SEEN

## 2020-11-15 LAB — LIPASE, BLOOD: Lipase: 17 U/L (ref 11–51)

## 2020-11-15 LAB — HCG, QUANTITATIVE, PREGNANCY: hCG, Beta Chain, Quant, S: <1 m[IU]/mL (ref <5)

## 2020-11-15 MED ORDER — ONDANSETRON 4 MG PO TBDP: 4.0000 mg | ORAL_TABLET | Freq: Three times a day (TID) | ORAL | 0 refills | Status: AC | PRN

## 2020-11-15 MED ORDER — ONDANSETRON HCL 4 MG/2ML IJ SOLN
4.0000 mg | Freq: Once | INTRAMUSCULAR | Status: AC
  Administered 2020-11-15: 4 mg via INTRAVENOUS

## 2020-11-15 MED ORDER — HYDROMORPHONE HCL 1 MG/ML IJ SOLN
0.5000 mg | Freq: Once | INTRAMUSCULAR | Status: AC
Start: 2020-11-15 — End: 2020-11-15
  Administered 2020-11-15: 0.5 mg via INTRAVENOUS
  Filled 2020-11-15: qty 1

## 2020-11-15 MED ORDER — IOHEXOL 300 MG/ML  SOLN
100.0000 mL | Freq: Once | INTRAMUSCULAR | Status: AC | PRN
  Administered 2020-11-15: 100 mL via INTRAVENOUS

## 2020-11-15 MED ORDER — ONDANSETRON HCL 4 MG/2ML IJ SOLN
4.0000 mg | Freq: Once | INTRAMUSCULAR | Status: AC
  Administered 2020-11-15: 4 mg via INTRAVENOUS
  Filled 2020-11-15: qty 2

## 2020-11-15 MED ORDER — MEGESTROL ACETATE 40 MG PO TABS: ORAL_TABLET | ORAL | 0 refills | Status: DC

## 2020-11-15 MED ORDER — ONDANSETRON HCL 4 MG/2ML IJ SOLN
INTRAMUSCULAR | Status: AC
  Filled 2020-11-15: qty 2

## 2020-11-15 NOTE — Telephone Encounter (Signed)
This has already been handled.

## 2020-11-15 NOTE — Patient Instructions (Signed)
Ethinyl Estradiol; Norelgestromin skin patches What is this medicine? ETHINYL ESTRADIOL;NORELGESTROMIN (ETH in il es tra DYE ole; nor el JES troe min) skin patch is used as a contraceptive (birth control method). This medicine combines two types of female hormones, an estrogen and a progestin. This patch is used to prevent ovulation and pregnancy. This medicine may be used for other purposes; ask your health care provider or pharmacist if you have questions. COMMON BRAND NAME(S): Ortho Becky Sax What should I tell my health care provider before I take this medicine? They need to know if you have or ever had any of these conditions:  abnormal vaginal bleeding  blood vessel disease or blood clots  breast, cervical, endometrial, ovarian, liver, or uterine cancer  diabetes  gallbladder disease  having surgery  heart disease or recent heart attack  high blood pressure  high cholesterol or triglycerides  history of irregular heartbeat or heart valve problems  kidney disease  liver disease  migraine headaches  protein C deficiency  protein S deficiency  recently had a baby, miscarriage, or abortion  stroke  systemic lupus erythematosus (SLE)  tobacco smoker  an unusual or allergic reaction to estrogens, progestins, other medicines, foods, dyes, or preservatives  pregnant or trying to get pregnant  breast-feeding How should I use this medicine? This patch is applied to the skin. Follow the directions on the prescription label. Apply to clean, dry, healthy skin on the buttock, abdomen, upper outer arm or upper torso, in a place where it will not be rubbed by tight clothing. Do not use lotions or other cosmetics on the site where the patch will go. Press the patch firmly in place for 10 seconds to ensure good contact with the skin. Change the patch every 7 days on the same day of the week for 3 weeks. You will then have a break from the patch for 1 week, after which you  will apply a new patch. Do not use your medicine more often than directed. Contact your pediatrician regarding the use of this medicine in children. Special care may be needed. This medicine has been used in female children who have started having menstrual periods. A patient package insert for the product will be given with each prescription and refill. Read this sheet carefully each time. The sheet may change frequently. Overdosage: If you think you have taken too much of this medicine contact a poison control center or emergency room at once. NOTE: This medicine is only for you. Do not share this medicine with others. What if I miss a dose? You will need to replace your patch once a week as directed. If your patch is lost or falls off, contact your health care professional for advice. You may need to use another form of birth control if your patch has been off for more than 1 day. What may interact with this medicine? Do not take this medicine with the following medications:  dasabuvir; ombitasvir; paritaprevir; ritonavir  ombitasvir; paritaprevir; ritonavir This medicine may also interact with the following medications:  acetaminophen  antibiotics or medicines for infections, especially rifampin, rifabutin, rifapentine, and possibly penicillins or tetracyclines  aprepitant or fosaprepitant  armodafinil  ascorbic acid (vitamin C)  barbiturate medicines, such as phenobarbital or primidone  bosentan  certain antiviral medicines for hepatitis, HIV or AIDS  certain medicines for cancer treatment  certain medicines for seizures like carbamazepine, clobazam, felbamate, lamotrigine, oxcarbazepine, phenytoin, rufinamide, topiramate  certain medicines for treating high cholesterol  cyclosporine  dantrolene  elagolix  flibanserin  grapefruit juice  lesinurad  medicines for diabetes  medicines to treat fungal infections, such as griseofulvin, miconazole, fluconazole,  ketoconazole, itraconazole, posaconazole or voriconazole  mifepristone  mitotane  modafinil  morphine  mycophenolate  St. John's wort  tamoxifen  temazepam  theophylline or aminophylline  thyroid hormones  tizanidine  tranexamic acid  ulipristal  warfarin This list may not describe all possible interactions. Give your health care provider a list of all the medicines, herbs, non-prescription drugs, or dietary supplements you use. Also tell them if you smoke, drink alcohol, or use illegal drugs. Some items may interact with your medicine. What should I watch for while using this medicine? Visit your doctor or health care professional for regular checks on your progress. You will need a regular breast and pelvic exam and Pap smear while on this medicine. Use an additional method of contraception during the first cycle that you use this patch. If you have any reason to think you are pregnant, stop using this medicine right away and contact your doctor or health care professional. If you are using this medicine for hormone related problems, it may take several cycles of use to see improvement in your condition. Smoking increases the risk of getting a blood clot or having a stroke while you are using hormonal birth control, especially if you are more than 23 years old. You are strongly advised not to smoke. This medicine can make your body retain fluid, making your fingers, hands, or ankles swell. Your blood pressure can go up. Contact your doctor or health care professional if you feel you are retaining fluid. This medicine can make you more sensitive to the sun. Keep out of the sun. If you cannot avoid being in the sun, wear protective clothing and use sunscreen. Do not use sun lamps or tanning beds/booths. If you wear contact lenses and notice visual changes, or if the lenses begin to feel uncomfortable, consult your eye care specialist. In some women, tenderness, swelling, or  minor bleeding of the gums may occur. Notify your dentist if this happens. Brushing and flossing your teeth regularly may help limit this. See your dentist regularly and inform your dentist of the medicines you are taking. If you are going to have elective surgery or a MRI, you may need to stop using this medicine before the surgery or MRI. Consult your health care professional for advice. This medicine does not protect you against HIV infection (AIDS) or any other sexually transmitted diseases. What side effects may I notice from receiving this medicine? Side effects that you should report to your doctor or health care professional as soon as possible:  allergic reactions such as skin rash or itching, hives, swelling of the lips, mouth, tongue, or throat  breast tissue changes or discharge  dark patches of skin on your forehead, cheeks, upper lip, and chin  depression  high blood pressure  migraines or severe, sudden headaches  missed menstrual periods  signs and symptoms of a blood clot such as breathing problems; changes in vision; chest pain; severe, sudden headache; pain, swelling, warmth in the leg; trouble speaking; sudden numbness or weakness of the face, arm or leg  skin reactions at the patch site such as blistering, bleeding, itching, rash, or swelling  stomach pain  yellowing of the eyes or skin Side effects that usually do not require medical attention (report these to your doctor or health care professional if they continue or are bothersome):  breast tenderness  irregular vaginal bleeding or spotting, particularly during the first 3 months of use  headache  nausea  painful menstrual periods  skin redness or mild irritation at site where applied  weight gain (slight) This list may not describe all possible side effects. Call your doctor for medical advice about side effects. You may report side effects to FDA at 1-800-FDA-1088. Where should I keep my  medicine? Keep out of the reach of children. Store at room temperature between 15 and 30 degrees C (59 and 86 degrees F). Keep the patch in its pouch until time of use. Throw away any unused medicine after the expiration date. Dispose of used patches properly. Since a used patch may still contain active hormones, fold the patch in half so that it sticks to itself prior to disposal. Throw away in a place where children or pets cannot reach. NOTE: This sheet is a summary. It may not cover all possible information. If you have questions about this medicine, talk to your doctor, pharmacist, or health care provider.  2021 [employer]/Gold Standard (2018-10-19 11:56:29) Etonogestrel; Ethinyl Estradiol Vaginal Ring What is this medicine? ETONOGESTREL; ETHINYL ESTRADIOL (et oh noe JES trel; ETH in il es tra DYE ole) vaginal ring is a flexible, vaginal ring used as a contraceptive (birth control method). This product combines two types of female hormones, an estrogen and a progestin. It is used to prevent ovulation and pregnancy. Each ring is effective for 1 month. This medicine may be used for other purposes; ask your health care provider or pharmacist if you have questions. COMMON BRAND NAME(S): EluRyng, NuvaRing What should I tell my health care provider before I take this medicine? They need to know if you have any of these conditions:  abnormal vaginal bleeding  blood vessel disease or blood clots  breast, cervical, endometrial, ovarian, liver, or uterine cancer  diabetes  gallbladder disease  having surgery  heart disease or recent heart attack  high blood pressure  high cholesterol or triglycerides  history of irregular heartbeat or heart valve problems  kidney disease  liver disease  migraine headaches  protein C deficiency  protein S deficiency  recently had a baby, miscarriage, or abortion  stroke  systemic lupus erythematosus (SLE)  tobacco smoker  your age is more  than 23 years old  an unusual or allergic reaction to estrogens, progestins, other medicines, foods, dyes, or preservatives  pregnant or trying to get pregnant  breast-feeding How should I use this medicine? Insert the ring into your vagina as directed. Follow the directions on the prescription label. The ring will remain place for 3 weeks and is then removed for a 1-week break. A new ring is inserted 1 week after the last ring was removed, on the same day of the week. Check often to make sure the ring is still in place. If the ring was out of the vagina for an unknown amount of time, you may not be protected from pregnancy. Perform a pregnancy test and call your doctor. Do not use more often than directed. A patient package insert for the product will be given with each prescription and refill. Read this sheet carefully each time. The sheet may change frequently. Contact your pediatrician regarding the use of this medicine in children. Special care may be needed. Overdosage: If you think you have taken too much of this medicine contact a poison control center or emergency room at once. NOTE: This medicine is only for you. Do not  share this medicine with others. What if I miss a dose? You will need to use the ring exactly as directed. It is very important to follow the schedule every cycle. If you do not use the ring as directed, you may not be protected from pregnancy. If the ring should slip out, is lost, or if you leave it in longer or shorter than you should, contact your health care professional for advice. What may interact with this medicine? Do not take this medicine with the following medications:  dasabuvir; ombitasvir; paritaprevir; ritonavir  ombitasvir; paritaprevir; ritonavir  vaginal lubricants or other vaginal products that are oil-based or silicone-based This medicine may also interact with the following medications:  acetaminophen  antibiotics or medicines for infections,  especially rifampin, rifabutin, rifapentine, and griseofulvin, and possibly penicillins or tetracyclines  aprepitant or fosaprepitant  armodafinil  ascorbic acid (vitamin C)  barbiturate medicines, such as phenobarbital or primidone  bosentan  certain antiviral medicines for hepatitis, HIV or AIDS  certain medicines for cancer treatment  certain medicines for seizures like carbamazepine, clobazam, felbamate, lamotrigine, oxcarbazepine, phenytoin, rufinamide, topiramate  certain medicines for treating high cholesterol  cyclosporine  dantrolene  elagolix  flibanserin  grapefruit juice  lesinurad  medicines for diabetes  medicines to treat fungal infections, such as griseofulvin, miconazole, fluconazole, ketoconazole, itraconazole, posaconazole or voriconazole  mifepristone  mitotane  modafinil  morphine  mycophenolate  St. John's wort  tamoxifen  temazepam  theophylline or aminophylline  thyroid hormones  tizanidine  tranexamic acid  ulipristal  warfarin This list may not describe all possible interactions. Give your health care provider a list of all the medicines, herbs, non-prescription drugs, or dietary supplements you use. Also tell them if you smoke, drink alcohol, or use illegal drugs. Some items may interact with your medicine. What should I watch for while using this medicine? Visit your doctor or health care professional for regular checks on your progress. You will need a regular breast and pelvic exam and Pap smear while on this medicine. Check with your doctor or health care professional to see if you need an additional method of contraception during the first cycle that you use this ring. Female condoms (made with natural rubber latex, polyisoprene, and polyurethane) and spermicides may be used. Do not use a diaphragm, cervical cap, or a female condom, as the ring can interfere with these birth control methods and their proper placement. If  you have any reason to think you are pregnant, stop using this medicine right away and contact your doctor or health care professional. If you are using this medicine for hormone related problems, it may take several cycles of use to see improvement in your condition. Smoking increases the risk of getting a blood clot or having a stroke while you are using hormonal birth control, especially if you are more than 23 years old. You are strongly advised not to smoke. Some women are prone to getting dark patches on the skin of the face (cholasma). Your risk of getting chloasma with this medicine is higher if you had chloasma during a pregnancy. Keep out of the sun. If you cannot avoid being in the sun, wear protective clothing and use sunscreen. Do not use sun lamps or tanning beds/booths. This medicine can make your body retain fluid, making your fingers, hands, or ankles swell. Your blood pressure can go up. Contact your doctor or health care professional if you feel you are retaining fluid. If you are going to have elective surgery,  you may need to stop using this medicine before the surgery. Consult your health care professional for advice. This medicine does not protect you against HIV infection (AIDS) or any other sexually transmitted diseases. What side effects may I notice from receiving this medicine? Side effects that you should report to your doctor or health care professional as soon as possible:  allergic reactions such as skin rash or itching, hives, swelling of the lips, mouth, tongue, or throat  depression  high blood pressure  migraines or severe, sudden headaches  signs and symptoms of a blood clot such as breathing problems; changes in vision; chest pain; severe, sudden headache; pain, swelling, warmth in the leg; trouble speaking; sudden numbness or weakness of the face, arm or leg  signs and symptoms of infection like fever or chills with dizziness and a sunburn-like rash, or pain  or trouble passing urine  stomach pain  symptoms of vaginal infection like itching, irritation or unusual discharge  yellowing of the eyes or skin Side effects that usually do not require medical attention (report these to your doctor or health care professional if they continue or are bothersome):  acne  breast pain, tenderness  irregular vaginal bleeding or spotting, particularly during the first month of use  mild headache  nausea  painful periods  vomiting This list may not describe all possible side effects. Call your doctor for medical advice about side effects. You may report side effects to FDA at 1-800-FDA-1088. Where should I keep my medicine? Keep out of the reach of children. Store unopened medicine for up to 4 months at room temperature at 15 and 30 degrees C (59 and 86 degrees F). Protect from light. Do not store above 30 degrees C (86 degrees F). Throw away any unused medicine 4 months after the dispense date or the expiration date, whichever comes first. A ring may only be used for 1 cycle (1 month). After the 3-week cycle, a used ring is removed and should be placed in the re-closable foil pouch and discarded in the trash out of reach of children and pets. Do NOT flush down the toilet. NOTE: This sheet is a summary. It may not cover all possible information. If you have questions about this medicine, talk to your doctor, pharmacist, or health care provider.  2021 [employer]/Gold Standard (2019-06-01 20:36:29) Levonorgestrel intrauterine device (IUD) What is this medicine? LEVONORGESTREL IUD (LEE voe nor jes trel) is a contraceptive (birth control) device. The device is placed inside the uterus by a health care provider. It is used to prevent pregnancy. Some devices can also be used to treat heavy bleeding that occurs during your period. This medicine may be used for other purposes; ask your health care provider or pharmacist if you have questions. COMMON BRAND NAME(S):  Cameron Ali What should I tell my health care provider before I take this medicine? They need to know if you have any of these conditions:  abnormal Pap smear  cancer of the breast, uterus, or cervix  diabetes  endometritis  genital or pelvic infection now or in the past  have more than one sexual partner or your partner has more than one partner  heart disease  history of an ectopic or tubal pregnancy  immune system problems  IUD in place  liver disease or tumor  problems with blood clots or take blood-thinners  seizures  use intravenous drugs  uterus of unusual shape  vaginal bleeding that has not been explained  an  unusual or allergic reaction to levonorgestrel, other hormones, silicone, or polyethylene, medicines, foods, dyes, or preservatives  pregnant or trying to get pregnant  breast-feeding How should I use this medicine? This device is placed inside the uterus by a health care professional. Talk to your pediatrician regarding the use of this medicine in children. Special care may be needed. Overdosage: If you think you have taken too much of this medicine contact a poison control center or emergency room at once. NOTE: This medicine is only for you. Do not share this medicine with others. What if I miss a dose? This does not apply. Depending on the brand of device you have inserted, the device will need to be replaced every 3 to 7 years if you wish to continue using this type of birth control. What may interact with this medicine? Do not take this medicine with any of the following medications:  amprenavir  bosentan  fosamprenavir This medicine may also interact with the following medications:  aprepitant  armodafinil  barbiturate medicines for inducing sleep or treating seizures  bexarotene  boceprevir  griseofulvin  medicines to treat seizures like carbamazepine, ethotoin, felbamate, oxcarbazepine, phenytoin,  topiramate  modafinil  pioglitazone  rifabutin  rifampin  rifapentine  some medicines to treat HIV infection like atazanavir, efavirenz, indinavir, lopinavir, nelfinavir, tipranavir, ritonavir  St. John's wort  warfarin This list may not describe all possible interactions. Give your health care provider a list of all the medicines, herbs, non-prescription drugs, or dietary supplements you use. Also tell them if you smoke, drink alcohol, or use illegal drugs. Some items may interact with your medicine. What should I watch for while using this medicine? Visit your doctor or health care professional for regular check ups. See your doctor if you or your partner has sexual contact with others, becomes HIV positive, or gets a sexual transmitted disease. This product does not protect you against HIV infection (AIDS) or other sexually transmitted diseases. You can check the placement of the IUD yourself by reaching up to the top of your vagina with clean fingers to feel the threads. Do not pull on the threads. It is a good habit to check placement after each menstrual period. Call your doctor right away if you feel more of the IUD than just the threads or if you cannot feel the threads at all. The IUD may come out by itself. You may become pregnant if the device comes out. If you notice that the IUD has come out use a backup birth control method like condoms and call your health care provider. Using tampons will not change the position of the IUD and are okay to use during your period. This IUD can be safely scanned with magnetic resonance imaging (MRI) only under specific conditions. Before you have an MRI, tell your healthcare provider that you have an IUD in place, and which type of IUD you have in place. What side effects may I notice from receiving this medicine? Side effects that you should report to your doctor or health care professional as soon as possible:  allergic reactions like skin  rash, itching or hives, swelling of the face, lips, or tongue  fever, flu-like symptoms  genital sores  high blood pressure  no menstrual period for 6 weeks during use  pain, swelling, warmth in the leg  pelvic pain or tenderness  severe or sudden headache  signs of pregnancy  stomach cramping  sudden shortness of breath  trouble with balance, talking, or  walking  unusual vaginal bleeding, discharge  yellowing of the eyes or skin Side effects that usually do not require medical attention (report to your doctor or health care professional if they continue or are bothersome):  acne  breast pain  change in sex drive or performance  changes in weight  cramping, dizziness, or faintness while the device is being inserted  headache  irregular menstrual bleeding within first 3 to 6 months of use  nausea This list may not describe all possible side effects. Call your doctor for medical advice about side effects. You may report side effects to FDA at 1-800-FDA-1088. Where should I keep my medicine? This does not apply. NOTE: This sheet is a summary. It may not cover all possible information. If you have questions about this medicine, talk to your doctor, pharmacist, or health care provider.  2021 [employer]vier/Gold Standard (2020-03-13 16:27:45) Medroxyprogesterone injection [Contraceptive] What is this medicine? MEDROXYPROGESTERONE (me DROX ee proe JES te rone) contraceptive injections prevent pregnancy. They provide effective birth control for 3 months. Depo-SubQ Provera 104 injection is also used for treating pain related to endometriosis. This medicine may be used for other purposes; ask your health care provider or pharmacist if you have questions. COMMON BRAND NAME(S): Depo-Provera, Depo-subQ Provera 104 What should I tell my health care provider before I take this medicine? They need to know if you have any of these conditions:  asthma  blood clots  breast cancer  or family history of breast cancer  depression  diabetes  eating disorder (anorexia nervosa)  heart attack  high blood pressure  HIV infection or AIDS  if you often drink alcohol  kidney disease  liver disease  migraine headaches  osteoporosis, weak bones  seizures  stroke  tobacco smoker  vaginal bleeding  an unusual or allergic reaction to medroxyprogesterone, other hormones, medicines, foods, dyes, or preservatives  pregnant or trying to get pregnant  breast-feeding How should I use this medicine? Depo-Provera CI contraceptive injection is given into a muscle. Depo-subQ Provera 104 injection is given under the skin. It is given by a health care provider in a hospital or clinic setting. The injection is usually given during the first 5 days after the start of a menstrual period or 6 weeks after delivery of a baby. A patient package insert for the product will be given with each prescription and refill. Be sure to read this information carefully each time. The sheet may change often. Talk to your pediatrician regarding the use of this medicine in children. Special care may be needed. These injections have been used in female children who have started having menstrual periods. Overdosage: If you think you have taken too much of this medicine contact a poison control center or emergency room at once. NOTE: This medicine is only for you. Do not share this medicine with others. What if I miss a dose? Keep appointments for follow-up doses. You must get an injection once every 3 months. It is important not to miss your dose. Call your health care provider if you are unable to keep an appointment. What may interact with this medicine?  antibiotics or medicines for infections, especially rifampin and griseofulvin  antivirals for HIV or hepatitis  aprepitant  armodafinil  bexarotene  bosentan  medicines for seizures like carbamazepine, felbamate, oxcarbazepine,  phenytoin, phenobarbital, primidone, topiramate  mitotane  modafinil  St. John's wort This list may not describe all possible interactions. Give your health care provider a list of all the medicines,  herbs, non-prescription drugs, or dietary supplements you use. Also tell them if you smoke, drink alcohol, or use illegal drugs. Some items may interact with your medicine. What should I watch for while using this medicine? This drug does not protect you against HIV infection (AIDS) or other sexually transmitted diseases. Use of this product may cause you to lose calcium from your bones. Loss of calcium may cause weak bones (osteoporosis). Only use this product for more than 2 years if other forms of birth control are not right for you. The longer you use this product for birth control the more likely you will be at risk for weak bones. Ask your health care professional how you can keep strong bones. You may have a change in bleeding pattern or irregular periods. Many females stop having periods while taking this drug. If you have received your injections on time, your chance of being pregnant is very low. If you think you may be pregnant, see your health care professional as soon as possible. Tell your health care professional if you want to get pregnant within the next year. The effect of this medicine may last a long time after you get your last injection. What side effects may I notice from receiving this medicine? Side effects that you should report to your doctor or health care professional as soon as possible:  allergic reactions like skin rash, itching or hives, swelling of the face, lips, or tongue  blood clot (chest pain; shortness of breath; pain, swelling, or warmth in the leg)  breast tenderness or discharge  changes in emotions or moods  changes in vision  liver injury (dark yellow or brown urine; general ill feeling or flu-like symptoms; loss of appetite, right upper belly pain;  unusually weak or tired, yellowing of the eyes or skin)  persistent pain, pus, or bleeding at the injection site  stroke (changes in vision; confusion; trouble speaking or understanding; severe headaches; sudden numbness or weakness of the face, arm or leg; trouble walking; dizziness; loss of balance or coordination)  trouble breathing Side effects that usually do not require medical attention (report to your doctor or health care professional if they continue or are bothersome):  change in sex drive  dizziness  fluid retention  headache  irregular periods, spotting, or absent periods  pain, redness, or irritation at site where injected  stomach pain  weight gain This list may not describe all possible side effects. Call your doctor for medical advice about side effects. You may report side effects to FDA at 1-800-FDA-1088. Where should I keep my medicine? This injection is only given by a health care provider. It will not be stored at home. NOTE: This sheet is a summary. It may not cover all possible information. If you have questions about this medicine, talk to your doctor, pharmacist, or health care provider.  2021 Elsevier/Gold Standard (2019-08-31 10:29:21)

## 2020-11-15 NOTE — Progress Notes (Signed)
GYNECOLOGY  VISIT   HPI: 25 y.o.   Single  Caucasian  female   G0P0000 with Patient's last menstrual period was 11/08/2020 (exact date).   here for follow up to ER visit. She's still having lower pelvic pain in both sides.  Heavy and painful menses are usual for patient.  Cycles are usually heavy for the first 4 - 5 days.  Had a period last month that lasted for 4 weeks just after she bought a house.   Patient seen on 10/22/20 by Dr. Seymour Bars for menorrhagia and anemia. Began Yasmin on 10/19/20 by PCP.  Dr. Seymour Bars recommended doubling up on her birth control pills.   This stopped her bleeding, but she developed significant cramping.  She was to return for a pelvic ultrasound.  Started her period again one week ago. Three days ago, her cramping intensified.  She contacted this office and received a recommendation for reduce back to one birth control pill daily, start Megace 40 mg daily, and take ibuprofen 600 - 800 mg every 8 hours as needed. Motrin 800 mg did not help.  She went to the urgent care yesterday and received Toradol, Naprosyn, and Tanazadine, which did not help.   She went ER last night for persistent and worsening pain. She had RLQ pain, suprapubic pain and guarding with no rebound.  No cervical motion tenderness.  Bleeding noted from the cervix.  Hgb 9.6. and WBC 11.4. K 3.2. Lipase 17. HCG <1.  CT today showed normal appendix and retroverted uterus with marked endometrial thickening, small amount of fluid in cul de sac, thickened bladder wall.   Pelvic US today showed uterus with no myometrial masses, EMS 18 mm and heterogeneously thickened, right ovary with several follicles and possible involuting follicle, left ovary normal, moderate fluid in the pelvis.  Patient received Fentanyl and antiemetic medication.   Not sexually active ever.   Denies dysuria.   No problems with excess bleeding if she has a laceration.  States her anemia is very recent.    GYNECOLOGIC HISTORY: Patient's last menstrual period was 11/08/2020 (exact date). Contraception:  Yasmin Menopausal hormone therapy:  n/a Last mammogram:  n/a Last pap smear:  NA        OB History    Gravida  0   Para  0   Term  0   Preterm  0   AB  0   Living  0     SAB  0   IAB  0   Ectopic  0   Multiple  0   Live Births  0              Patient Active Problem List   Diagnosis Date Noted  . Abnormal vaginal bleeding 10/20/2020  . Acute blood loss anemia 10/20/2020  . B12 deficiency 02/16/2020  . Menorrhagia with regular cycle 11/29/2018  . Vitamin D deficiency 11/29/2018  . Medication management 11/29/2018  . Allergic rhinitis 07/19/2018    Past Medical History:  Diagnosis Date  . Allergic rhinitis 07/19/2018    Past Surgical History:  Procedure Laterality Date  . WISDOM TOOTH EXTRACTION  2016    Current Outpatient Medications  Medication Sig Dispense Refill  . albuterol (VENTOLIN HFA) 108 (90 Base) MCG/ACT inhaler Inhale 2 puffs into the lungs every 4 (four) hours as needed for wheezing or shortness of breath. 1 Inhaler 0  . cetirizine (ZYRTEC) 10 MG tablet Take 10 mg by mouth daily.    . drospirenone-ethinyl estradiol (YASMIN  28) 3-0.03 MG tablet Take 1 tablet by mouth daily. 28 tablet 2  . ferrous sulfate 325 (65 FE) MG tablet Take 325 mg by mouth daily with breakfast.    . fluticasone (FLONASE) 50 MCG/ACT nasal spray Place 2 sprays into both nostrils at bedtime. 16 g 1  . ibuprofen (ADVIL) 800 MG tablet Take 1 tablet (800 mg total) by mouth every 8 (eight) hours as needed for cramping. 90 tablet 0  . megestrol (MEGACE) 40 MG tablet Take 1 tablet (40 mg total) by mouth daily for 10 days. 10 tablet 0  . Multiple Vitamins-Minerals (MULTIVITAMIN PO) Take by mouth daily.    . naproxen (NAPROSYN) 500 MG tablet Take 1 tablet (500 mg total) by mouth 2 (two) times daily. 30 tablet 0  . ondansetron (ZOFRAN ODT) 4 MG disintegrating tablet Take 1  tablet (4 mg total) by mouth every 8 (eight) hours as needed for up to 3 days for nausea or vomiting. 15 tablet 0  . tiZANidine (ZANAFLEX) 2 MG tablet Take 1-2 tablets (2-4 mg total) by mouth every 6 (six) hours as needed for muscle spasms. 30 tablet 0  . traMADol (ULTRAM) 50 MG tablet Take 1 tablet (50 mg total) by mouth every 6 (six) hours as needed for severe pain. 8 tablet 0   No current facility-administered medications for this visit.     ALLERGIES: Amoxicillin  Family History  Problem Relation Age of Onset  . Thyroid disease Mother   . Cancer Maternal Grandfather        prostate  . Skin cancer Maternal Grandfather   . Cancer Paternal Grandfather        brain  . Hashimoto's thyroiditis Sister   . Irritable bowel syndrome Maternal Grandmother   . Dementia Paternal Grandmother   . Irritable bowel syndrome Paternal Grandmother     Social History   Socioeconomic History  . Marital status: Single    Spouse name: Not on file  . Number of children: Not on file  . Years of education: Not on file  . Highest education level: Not on file  Occupational History  . Not on file  Tobacco Use  . Smoking status: Never Smoker  . Smokeless tobacco: Never Used  Vaping Use  . Vaping Use: Never used  Substance and Sexual Activity  . Alcohol use: Yes    Comment: Socially  . Drug use: No  . Sexual activity: Never    Partners: Male    Birth control/protection: None    Comment: VIRGIN  Other Topics Concern  . Not on file  Social History Narrative  . Not on file   Social Determinants of Health   Financial Resource Strain: Not on file  Food Insecurity: Not on file  Transportation Needs: Not on file  Physical Activity: Not on file  Stress: Not on file  Social Connections: Not on file  Intimate Partner Violence: Not on file    Review of Systems  Genitourinary: Positive for pelvic pain.  All other systems reviewed and are negative.   PHYSICAL EXAMINATION:    BP 100/70    Pulse 76   Ht 5' 4.5" (1.638 m)   Wt 138 lb (62.6 kg)   LMP 11/08/2020 (Exact Date)   SpO2 99%   BMI 23.32 kg/m     General appearance: alert, cooperative and appears stated age Lungs: clear to auscultation bilaterally Heart: regular rate and rhythm Abdomen: soft, mildly tender bilateral lower abdomen +/- guarding and no rebound, no masses,  no organomegaly No abnormal inguinal nodes palpated  Pelvic: External genitalia:  no lesions              Urethra:  normal appearing urethra with no masses, tenderness or lesions              Bartholins and Skenes: normal                 Vagina: normal appearing vagina with normal color and discharge, no lesions              Cervix: no lesions.  Blood and endometrial tissue and clot coming from os.  No CMT.                  Bimanual Exam:  Uterus:  normal size, tender.              Adnexa: bilateral tenderness and no masses.             Chaperone was present for exam.  ASSESSMENT  Menorrhagia with irregular menses.   Ruptured right ovarian cyst.  Anemia.  Thickened endometrium on pelvic ultrasound.   PLAN  Care and imaging reviewed.  Stop oral contraceptives.  Start Megace 40 mg po bid.  Rx for one month.  Options to transition to another medication to treat heavy and painful menses reviewed:  Depo Provera, Mirena IUD, NuvaRing, Angelene Giovanni, Orilissa, Depo Lupron.  On SloFe. FU in 1 week.  If bleeding does not improve, consider dilation and curettage.  30 min  total time was spent for this patient encounter, including preparation, face-to-face counseling with the patient, coordination of care, and documentation of the encounter.

## 2020-11-20 ENCOUNTER — Encounter: Payer: Self-pay | Admitting: Obstetrics & Gynecology

## 2020-11-20 ENCOUNTER — Other Ambulatory Visit: Payer: BC Managed Care – PPO

## 2020-11-20 ENCOUNTER — Other Ambulatory Visit: Payer: Self-pay

## 2020-11-20 ENCOUNTER — Other Ambulatory Visit: Payer: BC Managed Care – PPO | Admitting: Obstetrics & Gynecology

## 2020-11-20 ENCOUNTER — Ambulatory Visit: Payer: BC Managed Care – PPO | Admitting: Obstetrics & Gynecology

## 2020-11-20 VITALS — BP 126/84

## 2020-11-20 DIAGNOSIS — D649 Anemia, unspecified: Secondary | ICD-10-CM

## 2020-11-20 DIAGNOSIS — N921 Excessive and frequent menstruation with irregular cycle: Secondary | ICD-10-CM

## 2020-11-20 DIAGNOSIS — R9389 Abnormal findings on diagnostic imaging of other specified body structures: Secondary | ICD-10-CM | POA: Diagnosis not present

## 2020-11-21 ENCOUNTER — Other Ambulatory Visit: Payer: Self-pay | Admitting: *Deleted

## 2020-11-21 DIAGNOSIS — N939 Abnormal uterine and vaginal bleeding, unspecified: Secondary | ICD-10-CM

## 2020-11-23 ENCOUNTER — Encounter: Payer: Self-pay | Admitting: Obstetrics & Gynecology

## 2020-11-23 NOTE — Progress Notes (Signed)
    Natasha Woods 06-01-1996 [id number]        25 y.o.  G0P0000   RP: Refractory menometrorrhagia  HPI: Refractory menometrorrhagia with decreased bleeding on Megace, but still spotting and cramping.  Pelvic US done on 11/15/2020 showing a thickened heterogeneous endometrial line at 18 mm.  Secondary anemia.   OB History  Gravida Para Term Preterm AB Living  0 0 0 0 0 0  SAB IAB Ectopic Multiple Live Births  0 0 0 0 0    Past medical history,surgical history, problem list, medications, allergies, family history and social history were all reviewed and documented in the EPIC chart.   Directed ROS with pertinent positives and negatives documented in the history of present illness/assessment and plan.  Exam:  Vitals:   11/20/20 1604  BP: 126/84   General appearance:  Normal  Abdomen: Normal  Gynecologic exam: Deferred  Pelvic US 11/15/2020:  Uterus  Measurements: 8.3 x 4.7 x 5.2 cm = volume: 105 mL. Retroverted uterus. No concerning focal uterine mass or lesion.  Endometrium  Thickness: 18 mm.  Heterogeneously thickened endometrium.  Right ovary  Measurements: 3.8 x 1.6 x 1.4 cm = volume: 3.4 mL. Several normal follicles, a larger more elongated anechoic collection could reflect a collapsing/involuting follicle.  Left ovary  Measurements: 2.5 x 1.6 x 2 = volume: 4.3 mL. Normal follicles. No concerning adnexal lesion.  Pulsed Doppler evaluation of both ovaries demonstrates normal low-resistance arterial and venous waveforms.  Other findings  Moderate volume free fluid in the low pelvis with some areas demonstrating internal echogenic debris.  IMPRESSION: Retroverted uterus.  Marked, heterogeneous endometrial thickening. Given that bleeding remains unresponsive to hormonal/medical therapy, sonohysterogram should be considered for focal lesion work-up. (Ref: Radiological Reasoning: Algorithmic Workup of Abnormal Vaginal Bleeding  with Endovaginal Sonography and Sonohysterography. AJR 2008; 644:I34-74)  Moderate volume free fluid in the deep pelvis with some demonstrable echogenic debris, nonspecific though possibly related to a collapsing cyst/follicle seen in the right ovary.    Assessment/Plan:  25 y.o. G0P0000   1. Menometrorrhagia Refractory menometrorrhagia, currently improved on Megace, but not completely controled.  Pelvic US showed a thickened Endometrial line.  Will f/u to investigate further with a Sonohysterogram.  Procedure reviewed. - [place] Sonohysterogram; Future  2. Secondary anemia Hb at 9.6 on 11/15/2020.  On FeSO4 supplement.  Continue Megace.  3. Thickened endometrium Endometrial line thickened at 18 mm and heterogeneous.  F/U Sonohysterogram to r/o endometrial polyp/sm myoma.  May do EBx to r/o Endometrial hyperplasia. - [place] Sonohysterogram; Future  Genia Del MD, 10:47 PM 11/23/2020

## 2020-12-05 ENCOUNTER — Encounter: Payer: 59 | Admitting: Physician Assistant

## 2020-12-06 ENCOUNTER — Ambulatory Visit: Payer: BC Managed Care – PPO | Admitting: Obstetrics & Gynecology

## 2020-12-06 ENCOUNTER — Other Ambulatory Visit: Payer: Self-pay | Admitting: Obstetrics & Gynecology

## 2020-12-06 ENCOUNTER — Ambulatory Visit (INDEPENDENT_AMBULATORY_CARE_PROVIDER_SITE_OTHER): Payer: BC Managed Care – PPO

## 2020-12-06 ENCOUNTER — Other Ambulatory Visit: Payer: Self-pay

## 2020-12-06 DIAGNOSIS — N939 Abnormal uterine and vaginal bleeding, unspecified: Secondary | ICD-10-CM

## 2020-12-06 DIAGNOSIS — N921 Excessive and frequent menstruation with irregular cycle: Secondary | ICD-10-CM | POA: Diagnosis not present

## 2020-12-06 NOTE — Progress Notes (Signed)
    Natasha Woods October 05, 1995 [id number]        25 y.o.  G0 Single.  Virgin  RP: Menometrorrhagia with thickened endometrium for Sonohysto  HPI: Mild spotting only x 1 1/2 week on Megace.  No pelvic pain currently.  No abnormal vaginal discharge.  Urine and bowel movements normal.  No fever.   OB History  Gravida Para Term Preterm AB Living  0 0 0 0 0 0  SAB IAB Ectopic Multiple Live Births  0 0 0 0 0    Past medical history,surgical history, problem list, medications, allergies, family history and social history were all reviewed and documented in the EPIC chart.   Directed ROS with pertinent positives and negatives documented in the history of present illness/assessment and plan.  Exam:  There were no vitals filed for this visit. General appearance:  Normal  Pelvic US today: T/V images.  Anteverted uterus normal in size and shape with no myometrial mass.  The uterus is measured at 7.38 x 4.6 x 4.52 cm.  The endometrial lining is tri-layered and symmetrical with no mass or thickening seen.  The endometrial lining is measured at 5.64 mm.  Both ovaries are normal in size with normal follicular pattern.  The left ovary presents a collapsed avascular corpus luteum measured at 1.0 cm.  No adnexal mass.  No free fluid in the posterior cul-de-sac.  Sonohysterogram canceled due to normal pelvic ultrasound including a normal endometrial lining.   Assessment/Plan:  25 y.o. G0  1. Menometrorrhagia Pelvic ultrasound findings thoroughly reviewed with patient.  Patient reassured that her endometrial lining is now completely thin symmetrical dry layered and normal.  The sonohysterogram was therefore canceled.  Decision to observe.  We will stop Megace.  Declines birth control pills.  F/U at the time of her annual gynecologic exam.  Genia Del MD, 8:48 AM 12/06/2020

## 2020-12-11 ENCOUNTER — Encounter: Payer: Self-pay | Admitting: Obstetrics & Gynecology

## 2021-02-11 ENCOUNTER — Encounter: Payer: BC Managed Care – PPO | Admitting: Nurse Practitioner

## 2021-02-18 ENCOUNTER — Encounter: Payer: 59 | Admitting: Adult Health Nurse Practitioner

## 2021-02-18 ENCOUNTER — Encounter: Payer: BC Managed Care – PPO | Admitting: Nurse Practitioner

## 2021-02-21 NOTE — Progress Notes (Signed)
Complete Physical  Assessment and Plan:  Natasha Woods was seen today for annual exam.  Diagnoses and all orders for this visit:  Encounter for routine adult health examination without abnormal findings  Discussed STD testing, safe sex, alcohol and drug awareness, drinking and driving dangers, wearing a seat belt and general safety measures for young adult. - not sexually active, no needs for PAP at this time -     CBC with Differential/Platelet -     COMPLETE METABOLIC PANEL WITH GFR -     Lipid panel -     TSH  Medication management -     CBC with Differential/Platelet -     COMPLETE METABOLIC PANEL WITH GFR -     Lipid panel -     TSH  Screening for thyroid disorder -     TSH  Screening for deficiency anemia -     CBC with Differential/Platelet  BMI 22.0-22.9, adult       - Continue with diet high in fruits/vegetables and fiber, limiting saturated fats. Continue regular exercise.   Screening, lipid -     Lipid panel  Menometrorrhagia -     CBC with Differential/Platelet - Monitor menstrual cycles, if continues to have spotting many days of the month a low dose estrogen OC should control menstrual cycles      Discussed med's effects and SE's. Screening labs and tests as requested with regular follow-up as recommended. Over 40 minutes of exam, counseling, chart review and critical decision making was performed  Future Appointments  Date Time Provider Department Center  03/08/2021 11:00 AM Genia Del, MD [facility] None  02/25/2022  9:00 AM Revonda Humphrey, NP [facility] None     HPI  This very nice 25 y.o.female presents for complete physical. She has Allergic rhinitis; Menorrhagia with regular cycle; Vitamin D deficiency; Medication management; B12 deficiency; Abnormal vaginal bleeding; and Acute blood loss anemia on their problem list. Patient reports no complaints at this time.   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 She bought a house and new car.  Foster placement this week, may be getting 1 or 2 children. Dating but never sexually active, plans to wait until marriage.   Menses are abnormal and has had irregular bleeding with periods starting and stopping, normal cycle prior to OC last year was heavy bleeding for 6-7 days with more abdominal cramping.  She was on oral contraceptives previously to control her menstrual cycle and was taken off to see if her cycle was regulated.  Much lighter at this point but have spotting many days of the month  Has year round allergies, prone to severe sinus infections. Takes zyrtec, flonase, mucinex, etc with fair results.   BMI is Body mass index is 23.06 kg/m., she has been working on diet and exercise, walking with her friend more since the pandemic, doing 3.5 miles over 1 hour or, 3-5 days a week.  Lots of water - 32 oz x 4 water daily minimum No caffeine,  Gets minimum 6 hours of sleep, prioritizes  Eats at home, estimates 2-3 servings of veggies, 3-4 servings of fruit daily  Leans meats mostly, not much processed  No alcohol  Wt Readings from Last 3 Encounters:  02/25/21 138 lb 9.6 oz (62.9 kg)  11/15/20 138 lb (62.6 kg)  11/14/20 133 lb (60.3 kg)    Her blood pressure has been controlled at home, today their BP is BP: 104/62  She does workout,  She denies chest  pain, shortness of breath, dizziness.   She is not on cholesterol medication and denies myalgias. Her cholesterol is at goal. The cholesterol last visit was:   Lab Results  Component Value Date   CHOL 167 11/29/2018   HDL 53 11/29/2018   LDLCALC 97 11/29/2018   TRIG 81 11/29/2018   CHOLHDL 3.2 11/29/2018   Last K5G in the office was:  Lab Results  Component Value Date   HGBA1C 5.1 03/15/2015      Current Medications:  Current Outpatient Medications on File Prior to Visit  Medication Sig Dispense Refill   albuterol (VENTOLIN HFA) 108 (90 Base) MCG/ACT inhaler Inhale 2 puffs into the lungs every 4 (four) hours as needed  for wheezing or shortness of breath. 1 Inhaler 0   cetirizine (ZYRTEC) 10 MG tablet Take 10 mg by mouth daily.     ferrous sulfate 325 (65 FE) MG tablet Take 325 mg by mouth daily with breakfast.     fluticasone (FLONASE) 50 MCG/ACT nasal spray Place 2 sprays into both nostrils at bedtime. 16 g 1   ibuprofen (ADVIL) 800 MG tablet Take 1 tablet (800 mg total) by mouth every 8 (eight) hours as needed for cramping. 90 tablet 0   megestrol (MEGACE) 40 MG tablet Take one tablet by mouth twice a day.  Take for one month. 60 tablet 0   Multiple Vitamins-Minerals (MULTIVITAMIN PO) Take by mouth daily.     naproxen (NAPROSYN) 500 MG tablet Take 1 tablet (500 mg total) by mouth 2 (two) times daily. 30 tablet 0   tiZANidine (ZANAFLEX) 2 MG tablet Take 1-2 tablets (2-4 mg total) by mouth every 6 (six) hours as needed for muscle spasms. 30 tablet 0   traMADol (ULTRAM) 50 MG tablet Take 1 tablet (50 mg total) by mouth every 6 (six) hours as needed for severe pain. 8 tablet 0   No current facility-administered medications on file prior to visit.   Health Maintenance:   Immunization History  Administered Date(s) Administered   HPV 9-valent 03/15/2015, 05/28/2015, 09/06/2015   Meningococcal Conjugate 05/30/2014   PFIZER(Purple Top)SARS-COV-2 Vaccination 10/01/2019, 10/22/2019   PPD Test 10/19/2017, 02/16/2020   Tdap 07/28/2012    TD/TDAP: 2014 Influenza: 2019 on campus Pneumovax: N/A  Prevnar 13: N/A HPV vaccines: completed meningitis vaccine 2015 Covid 19: 2/2, 2021, pfizer   LMP: Patient's last menstrual period was 02/18/2021. Sexually Active: no STD testing offered Pap: not needed at this time - never sexually active  MGM: N/A  Allergies:  Allergies  Allergen Reactions   Amoxicillin Hives   Medical History:  has Allergic rhinitis; Menorrhagia with regular cycle; Vitamin D deficiency; Medication management; B12 deficiency; Abnormal vaginal bleeding; and Acute blood loss anemia on their  problem list. Surgical History:  She  has a past surgical history that includes Wisdom tooth extraction (2016). Family History:  Her family history includes Cancer in her maternal grandfather and paternal grandfather; Dementia in her paternal grandmother; Hashimoto's thyroiditis in her sister; Irritable bowel syndrome in her maternal grandmother and paternal grandmother; Skin cancer in her maternal grandfather; Thyroid disease in her mother. Social History:   reports that she has never smoked. She has never used smokeless tobacco. She reports current alcohol use. She reports that she does not use drugs.  Review of Systems: Review of Systems  Constitutional: Negative.  Negative for chills, fever, malaise/fatigue and weight loss.  HENT: Negative.  Negative for congestion, hearing loss, sinus pain, sore throat and tinnitus.   Eyes: Negative.  Negative for blurred vision and double vision.  Respiratory: Negative.  Negative for cough, hemoptysis, sputum production, shortness of breath and wheezing.   Cardiovascular: Negative.  Negative for chest pain, palpitations, orthopnea, claudication and leg swelling.  Gastrointestinal: Negative.  Negative for abdominal pain, blood in stool, constipation, diarrhea, heartburn, melena, nausea and vomiting.  Genitourinary: Negative.  Negative for dysuria and urgency.       Irregular spotting and cramping with periods  Musculoskeletal: Negative.  Negative for back pain, falls, joint pain, myalgias and neck pain.       Popping of right knee with movement  Skin: Negative.  Negative for rash.  Neurological:  Negative for dizziness, tingling, tremors, sensory change, weakness and headaches.  Endo/Heme/Allergies:  Negative for polydipsia. Does not bruise/bleed easily.  Psychiatric/Behavioral: Negative.  Negative for depression and suicidal ideas. The patient is not nervous/anxious and does not have insomnia.   All other systems reviewed and are negative.  Physical  Exam: Estimated body mass index is 23.06 kg/m as calculated from the following:   Height as of this encounter: 5\' 5"  (1.651 m).   Weight as of this encounter: 138 lb 9.6 oz (62.9 kg). BP 104/62   Pulse 88   Temp (!) 97.5 F (36.4 C)   Ht 5\' 5"  (1.651 m)   Wt 138 lb 9.6 oz (62.9 kg)   LMP 02/18/2021   SpO2 99%   BMI 23.06 kg/m  General Appearance: Well nourished, in no apparent distress.  Eyes: PERRLA, EOMs, conjunctiva no swelling or erythema Sinuses: No Frontal/maxillary tenderness  ENT/Mouth: Ext aud canals clear, normal light reflex with TMs without erythema, bulging. Good dentition. No erythema, swelling, or exudate on post pharynx. Tonsils not swollen or erythematous. Hearing normal.  Neck: Supple, thyroid normal. No bruits  Respiratory: Respiratory effort normal, BS equal bilaterally without rales, rhonchi, wheezing or stridor.  Cardio: RRR without murmurs, rubs or gallops. Brisk peripheral pulses without edema.  Chest: symmetric, with normal excursions and percussion.  Breasts: Defer Abdomen: Soft, nontender, no guarding, rebound, hernias, masses, or organomegaly.  Lymphatics: Non tender without lymphadenopathy.  Genitourinary: defer Musculoskeletal: Full ROM all peripheral extremities,5/5 strength, and normal gait.  Popping sound of right knee with movement , nontender Skin: Warm, dry without rashes, lesions, ecchymosis. Neuro: Cranial nerves intact, reflexes equal bilaterally. Normal muscle tone, no cerebellar symptoms. Sensation intact.  Psych: Awake and oriented X 3, normal affect, Insight and Judgment appropriate.   EKG: defer  Natasha Woods W Natasha Woods 10:00 AM [facility]

## 2021-02-25 ENCOUNTER — Encounter: Payer: Self-pay | Admitting: Nurse Practitioner

## 2021-02-25 ENCOUNTER — Other Ambulatory Visit: Payer: Self-pay

## 2021-02-25 ENCOUNTER — Ambulatory Visit (INDEPENDENT_AMBULATORY_CARE_PROVIDER_SITE_OTHER): Payer: BC Managed Care – PPO | Admitting: Nurse Practitioner

## 2021-02-25 VITALS — BP 104/62 | HR 88 | Temp 97.5°F | Ht 65.0 in | Wt 138.6 lb

## 2021-02-25 DIAGNOSIS — Z13220 Encounter for screening for lipoid disorders: Secondary | ICD-10-CM

## 2021-02-25 DIAGNOSIS — Z0000 Encounter for general adult medical examination without abnormal findings: Secondary | ICD-10-CM

## 2021-02-25 DIAGNOSIS — Z6822 Body mass index (BMI) 22.0-22.9, adult: Secondary | ICD-10-CM

## 2021-02-25 DIAGNOSIS — Z131 Encounter for screening for diabetes mellitus: Secondary | ICD-10-CM

## 2021-02-25 DIAGNOSIS — Z79899 Other long term (current) drug therapy: Secondary | ICD-10-CM

## 2021-02-25 DIAGNOSIS — Z130 Encounter for screening for diseases of the blood and blood-forming organs and certain disorders involving the immune mechanism: Secondary | ICD-10-CM

## 2021-02-25 DIAGNOSIS — E559 Vitamin D deficiency, unspecified: Secondary | ICD-10-CM

## 2021-02-25 DIAGNOSIS — Z1329 Encounter for screening for other suspected endocrine disorder: Secondary | ICD-10-CM

## 2021-02-25 DIAGNOSIS — Z1389 Encounter for screening for other disorder: Secondary | ICD-10-CM

## 2021-02-25 DIAGNOSIS — N921 Excessive and frequent menstruation with irregular cycle: Secondary | ICD-10-CM

## 2021-02-25 LAB — CBC WITH DIFFERENTIAL/PLATELET
Absolute Monocytes: 414 cells/uL (ref 200–950)
Basophils Absolute: 32 cells/uL (ref 0–200)
Basophils Relative: 0.7 %
Eosinophils Absolute: 78 cells/uL (ref 15–500)
Eosinophils Relative: 1.7 %
HCT: 40.5 % (ref 35.0–45.0)
Hemoglobin: 12.8 g/dL (ref 11.7–15.5)
Lymphs Abs: 1136 cells/uL (ref 850–3900)
MCH: 27.3 pg (ref 27.0–33.0)
MCHC: 31.6 g/dL — ABNORMAL LOW (ref 32.0–36.0)
MCV: 86.4 fL (ref 80.0–100.0)
MPV: 9.4 fL (ref 7.5–12.5)
Monocytes Relative: 9 %
Neutro Abs: 2939 cells/uL (ref 1500–7800)
Neutrophils Relative %: 63.9 %
Platelets: 336 10*3/uL (ref 140–400)
RBC: 4.69 10*6/uL (ref 3.80–5.10)
RDW: 14.6 % (ref 11.0–15.0)
Total Lymphocyte: 24.7 %
WBC: 4.6 10*3/uL (ref 3.8–10.8)

## 2021-02-25 LAB — LIPID PANEL
Cholesterol: 181 mg/dL (ref <200)
HDL: 59 mg/dL (ref >=50)
LDL Cholesterol (Calc): 106 mg/dL (calc) — ABNORMAL HIGH
Non-HDL Cholesterol (Calc): 122 mg/dL (calc) (ref <130)
Total CHOL/HDL Ratio: 3.1 (calc) (ref <5.0)
Triglycerides: 74 mg/dL (ref <150)

## 2021-02-25 LAB — COMPLETE METABOLIC PANEL WITH GFR
AG Ratio: 1.8 (calc) (ref 1.0–2.5)
ALT: 12 U/L (ref 6–29)
AST: 15 U/L (ref 10–30)
Albumin: 4.4 g/dL (ref 3.6–5.1)
Alkaline phosphatase (APISO): 54 U/L (ref 31–125)
BUN: 8 mg/dL (ref 7–25)
CO2: 24 mmol/L (ref 20–32)
Calcium: 9.2 mg/dL (ref 8.6–10.2)
Chloride: 105 mmol/L (ref 98–110)
Creat: 0.68 mg/dL (ref 0.50–0.96)
Globulin: 2.5 g/dL (calc) (ref 1.9–3.7)
Glucose, Bld: 82 mg/dL (ref 65–99)
Potassium: 4.2 mmol/L (ref 3.5–5.3)
Sodium: 138 mmol/L (ref 135–146)
Total Bilirubin: 0.8 mg/dL (ref 0.2–1.2)
Total Protein: 6.9 g/dL (ref 6.1–8.1)
eGFR: 125 mL/min/{1.73_m2} (ref >=60)

## 2021-02-25 LAB — TSH: TSH: 0.9 mIU/L

## 2021-03-06 ENCOUNTER — Encounter: Payer: BC Managed Care – PPO | Admitting: Nurse Practitioner

## 2021-03-08 ENCOUNTER — Ambulatory Visit: Payer: BC Managed Care – PPO | Admitting: Obstetrics & Gynecology

## 2021-04-08 ENCOUNTER — Ambulatory Visit: Payer: BC Managed Care – PPO | Admitting: Internal Medicine

## 2021-04-08 ENCOUNTER — Encounter: Payer: Self-pay | Admitting: Internal Medicine

## 2021-04-08 ENCOUNTER — Other Ambulatory Visit: Payer: Self-pay

## 2021-04-08 VITALS — BP 120/78 | HR 79 | Temp 97.5°F | Resp 16 | Ht 65.0 in | Wt 140.2 lb

## 2021-04-08 DIAGNOSIS — E162 Hypoglycemia, unspecified: Secondary | ICD-10-CM

## 2021-04-08 DIAGNOSIS — R42 Dizziness and giddiness: Secondary | ICD-10-CM | POA: Diagnosis not present

## 2021-04-08 DIAGNOSIS — R55 Syncope and collapse: Secondary | ICD-10-CM

## 2021-04-08 DIAGNOSIS — D500 Iron deficiency anemia secondary to blood loss (chronic): Secondary | ICD-10-CM

## 2021-04-08 DIAGNOSIS — E559 Vitamin D deficiency, unspecified: Secondary | ICD-10-CM

## 2021-04-08 DIAGNOSIS — Z79899 Other long term (current) drug therapy: Secondary | ICD-10-CM

## 2021-04-08 NOTE — Progress Notes (Signed)
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 Later at about 1hr 48 min later, while standing she became light-headed , "saw spots" and felt faint & lay down. Her teaching assistant called fire department who monitored & reported nl BP & pulse, but low blood sugar "36". She was given snacks & liquids and gradually began to feel better. As she reported some pressure in her Rt chest, EMS also came and she declined transport to ER.  She is not on hypoglycemics or other meds. She does have hx/o menses related anemia responding to iron supplements with last hgb 12.8 gm%. She is active, but is not exercising regularly. She denies any significant hx/o HA, postural dizziness, palpitations , dyspnea or GI sx's.  She does have hx/o very low Vit D  of "20" in 2020 and is on no supplements.   Medications    albuterol HFA  inhaler, Inhale 2 puffs every 4 (hours as needed for wheezing or shortness of breath.   cetirizine 10 MG tablet, Take  daily.   FLONASE nasal spray, Place 2 sprays into both nostrils at bedtime.   ferrous sulfate 325 (65 FE) MG tablet, Take  daily with breakfast.   Multiple Vitamins-Minerals , Take by mouth daily.  Problem list She has Allergic rhinitis; Menorrhagia with regular cycle; Vitamin D deficiency; Medication management; B12 deficiency; Abnormal vaginal bleeding; and Acute blood loss anemia on their problem list.   Observations/Objective:  BP 120/78   Pulse 79   Temp (!) 97.5 F (36.4 C)   Resp 16   Ht 5\' 5"  (1.651 m)   Wt 140 lb 3.2 oz (63.6 kg)   SpO2 99%   BMI 23.33 kg/m   Postural Sitting BP 118/86     P 76            &                 Standing BP 123/85      P 80   HEENT - WNL. Neck - supple.  Chest - Clear equal BS. Cor - Nl HS. RRR w/o  sig MGR. PP 1(+). No edema. Abd - Soft. Benign. Non tender.  MS- FROM w/o deformities.  Gait Nl. Neuro -  Nl w/o focal abnormalities.  Assessment and Plan:   1. Postural dizziness with presyncope  - CBC with Differential/Platelet - COMPLETE METABOLIC PANEL WITH GFR  2. Iron deficiency anemia due to chronic blood loss  - CBC with Differential/Platelet  3. Hypoglycemia  - Hemoglobin A1c - Insulin, random  4. Vitamin D deficiency  - VITAMIN D 25 Hydroxy  5. Medication management  - CBC with Differential/Platelet - COMPLETE METABOLIC PANEL WITH GFR - Hemoglobin A1c - Insulin, random - VITAMIN D 25 Hydroxy   Follow Up Instructions:        I discussed the assessment and treatment plan with the patient. The patient was provided an opportunity to ask questions and all were answered. The patient agreed with the plan and demonstrated an understanding of the instructions.       The patient was advised to call back or seek an in-person evaluation if the symptoms worsen or if the condition fails to improve as anticipated.  Kirtland Bouchard, MD

## 2021-04-08 NOTE — Patient Instructions (Signed)
Near-Syncope Near-syncope is when you suddenly feel like you might pass out (faint), but you do not actually lose consciousness. This may also be referred to as presyncope. During an episode of near-syncope, you may: Feel dizzy, weak, or light-headed. Feel nauseous. See all white or all black in your field of vision, or see spots. Have cold, clammy skin. This condition is caused by a sudden decrease in blood flow to the brain. This decrease can result from various causes, but most of those causes are not dangerous. However, near-syncope may be a sign of a serious medical problem, so it is important to seek medical care. Follow these instructions at home: Medicines Take over-the-counter and prescription medicines only as told by your health care provider. If you are taking blood pressure or heart medicine, get up slowly and take several minutes to sit and then stand. This can reduce dizziness. General instructions Pay attention to any changes in your symptoms. Talk with your health care provider about your symptoms. You may need to have testing to understand the cause of your near-syncope. If you start to feel like you might faint, lie down right away and raise (elevate) your feet above the level of your heart. Breathe deeply and steadily. Wait until all of the symptoms have passed. Have someone stay with you until you feel stable. Do not drive, use machinery, or play sports until your health care provider says it is okay. Drink enough fluid to keep your urine pale yellow. Keep all follow-up visits as told by your health care provider. This is important. Get help right away if you: Have a seizure. Have unusual pain in your chest, abdomen, or back. Faint once or repeatedly. Have a severe headache. Are bleeding from your mouth or rectum, or you have black or tarry stool. Have a very fast or irregular heartbeat (palpitations). Are confused. Have trouble walking. Have severe weakness. Have  vision problems. These symptoms may represent a serious problem that is an emergency. Do not wait to see if your symptoms will go away. Get medical help right away. Call your local emergency services (911 in the [place] Do not drive yourself to the hospital. Summary Near-syncope is when you suddenly feel like you might pass out (faint), but you do not actually lose consciousness. This condition is caused by a sudden decrease in blood flow to the brain. This decrease can result from various causes, but most of those causes are not dangerous. Near-syncope may be a sign of a serious medical problem, so it is important to seek medical care. =========================== Hypoglycemia Hypoglycemia occurs when the level of sugar (glucose) in the blood is too low. Hypoglycemia can happen in people who have or do not have diabetes. It can develop quickly, and it can be a medical emergency. For most people, a blood glucose level below 70 mg/dL (3.9 mmol/L) is considered hypoglycemia. Glucose is a type of sugar that provides the body's main source of energy. Certain hormones (insulin and glucagon) control the level of glucose in the blood. Insulin lowers blood glucose, and glucagon raises blood glucose. Hypoglycemia can result from having too much insulin in the bloodstream, or from not eating enough food that contains glucose. You may also have reactive hypoglycemia, which happens within 4 hours after eating a meal. What are the causes? Hypoglycemia occurs most often in people who have diabetes and may be caused by: Diabetes medicine. Not eating enough, or not eating often enough. Increased physical activity. Drinking alcohol on an empty  stomach. If you do not have diabetes, hypoglycemia may be caused by: A tumor in the pancreas. Not eating enough, or not eating for long periods at a time (fasting). A severe infection or illness. Problems after having bariatric surgery. Organ failure, such as kidney or liver  failure. Certain medicines. What increases the risk? Hypoglycemia is more likely to develop in people who: Have diabetes and take medicines to lower blood glucose. Abuse alcohol. Have a severe illness. What are the signs or symptoms? Symptoms vary depending on whether the condition is mild, moderate, or severe. Mild hypoglycemia Hunger. Sweating and feeling clammy. Dizziness or feeling light-headed. Sleepiness or restless sleep. Nausea. Increased heart rate. Headache. Blurry vision. Mood changes, such as irritability or anxiety. Tingling or numbness around the mouth, lips, or tongue. Moderate hypoglycemia Confusion and poor judgment. Behavior changes. Weakness. Irregular heartbeat. A change in coordination. Severe hypoglycemia Severe hypoglycemia is a medical emergency. It can cause: Fainting. Seizures. Loss of consciousness (coma). Death. How is this diagnosed? Hypoglycemia is diagnosed with a blood test to measure your blood glucose level. This blood test is done while you are having symptoms. Your health care provider may also do a physical exam and review your medical history. How is this treated? This condition can be treated by immediately eating or drinking something that contains sugar with 15 grams of fast-acting carbohydrate, such as: 4 oz (120 mL) of fruit juice. 4 oz (120 mL) of regular soda (not diet soda). Several pieces of hard candy. Check food labels to find out how many pieces to eat for 15 grams. 1 Tbsp (15 mL) of sugar or honey. 4 glucose tablets. 1 tube of glucose gel. Treating hypoglycemia if you have diabetes If you are alert and able to swallow safely, follow the 15:15 rule: Take 15 grams of a fast-acting carbohydrate. Talk with your health care provider about how much you should take. Options for getting 15 grams of fast-acting carbohydrate include: Glucose tablets (take 4 tablets). Several pieces of hard candy. Check food labels to find out how  many pieces to eat for 15 grams. 4 oz (120 mL) of fruit juice. 4 oz (120 mL) of regular soda (not diet soda). 1 Tbsp (15 mL) of sugar or honey. 1 tube of glucose gel. Check your blood glucose 15 minutes after you take the carbohydrate. If the repeat blood glucose level is still at or below 70 mg/dL (3.9 mmol/L), take 15 grams of a carbohydrate again. If your blood glucose level does not increase above 70 mg/dL (3.9 mmol/L) after 3 tries, seek emergency medical care. After your blood glucose level returns to normal, eat a meal or a snack within 1 hour.  Treating severe hypoglycemia Severe hypoglycemia is when your blood glucose level is below 54 mg/dL (3 mmol/L). Severe hypoglycemia is a medical emergency. Get medical help right away. If you have severe hypoglycemia and you cannot eat or drink, you will need to be given glucagon. A family member or close friend should learn how to check your blood glucose and how to give you glucagon. Ask your health care provider if you need to have an emergency glucagon kit available. Severe hypoglycemia may need to be treated in a hospital. The treatment may include getting glucose through an IV. You may also need treatment for the cause of your hypoglycemia. Follow these instructions at home: General instructions Take over-the-counter and prescription medicines only as told by your health care provider. Monitor your blood glucose as told by your  health care provider. If you drink alcohol: Limit how much you have to: 0-1 drink a day for women who are not pregnant. 0-2 drinks a day for men. Know how much alcohol is in your drink. In the [place] one drink equals one 12 oz bottle of beer (355 mL), one 5 oz glass of wine (148 mL), or one 1 oz glass of hard liquor (44 mL). Be sure to eat food along with drinking alcohol. Be aware that alcohol is absorbed quickly and may have lingering effects that may result in hypoglycemia later. Be sure to do ongoing glucose  monitoring. Keep all follow-up visits. This is important. If you have diabetes: Always have a fast-acting carbohydrate (15 grams) option with you to treat low blood glucose. Follow your diabetes management plan as directed by your health care provider. Make sure you: Know the symptoms of hypoglycemia. It is important to treat it right away to prevent it from becoming severe. Check your blood glucose as often as told. Always check before and after exercise. Always check your blood glucose before you drive a motorized vehicle. Take your medicines as told. Follow your meal plan. Eat on time, and do not skip meals. Share your diabetes management plan with people in your workplace, school, and household. Carry a medical alert card or wear medical alert jewelry. Where to find more information American Diabetes Association: www.diabetes.org Contact a health care provider if: You have problems keeping your blood glucose in your target range. You have frequent episodes of hypoglycemia. Get help right away if: You continue to have hypoglycemia symptoms after eating or drinking something that contains 15 grams of fast-acting carbohydrate, and you cannot get your blood glucose above 70 mg/dL (3.9 mmol/L) while following the 15:15 rule. Your blood glucose is below 54 mg/dL (3 mmol/L). You have a seizure. You faint. These symptoms may represent a serious problem that is an emergency. Do not wait to see if the symptoms will go away. Get medical help right away. Call your local emergency services (911 in the [place] Do not drive yourself to the hospital. Summary Hypoglycemia occurs when the level of sugar (glucose) in the blood is too low. Hypoglycemia can happen in people who have or do not have diabetes. It can develop quickly, and it can be a medical emergency. Make sure you know the symptoms of hypoglycemia and how to treat it. Always have a fast-acting carbohydrate option with you to treat low blood  sugar. This information is not intended to replace advice given to you by your health care provider. Make sure you discuss any questions you have with your health care provider. Document Revised: 06/14/2020 Document Reviewed: 06/14/2020 Elsevier Patient Education  2022 [employer].

## 2021-04-09 ENCOUNTER — Telehealth: Payer: Self-pay

## 2021-04-09 LAB — INSULIN, RANDOM: Insulin: 13.2 u[IU]/mL

## 2021-04-09 LAB — COMPLETE METABOLIC PANEL WITH GFR
AG Ratio: 1.6 (calc) (ref 1.0–2.5)
ALT: 15 U/L (ref 6–29)
AST: 16 U/L (ref 10–30)
Albumin: 4.6 g/dL (ref 3.6–5.1)
Alkaline phosphatase (APISO): 56 U/L (ref 31–125)
BUN: 9 mg/dL (ref 7–25)
CO2: 27 mmol/L (ref 20–32)
Calcium: 9.6 mg/dL (ref 8.6–10.2)
Chloride: 106 mmol/L (ref 98–110)
Creat: 0.66 mg/dL (ref 0.50–0.96)
Globulin: 2.9 g/dL (calc) (ref 1.9–3.7)
Glucose, Bld: 88 mg/dL (ref 65–99)
Potassium: 4.1 mmol/L (ref 3.5–5.3)
Sodium: 141 mmol/L (ref 135–146)
Total Bilirubin: 0.3 mg/dL (ref 0.2–1.2)
Total Protein: 7.5 g/dL (ref 6.1–8.1)
eGFR: 126 mL/min/{1.73_m2} (ref >=60)

## 2021-04-09 LAB — CBC WITH DIFFERENTIAL/PLATELET
Absolute Monocytes: 551 cells/uL (ref 200–950)
Basophils Absolute: 38 cells/uL (ref 0–200)
Basophils Relative: 0.7 %
Eosinophils Absolute: 103 cells/uL (ref 15–500)
Eosinophils Relative: 1.9 %
HCT: 39.7 % (ref 35.0–45.0)
Hemoglobin: 12.8 g/dL (ref 11.7–15.5)
Lymphs Abs: 1895 cells/uL (ref 850–3900)
MCH: 27.9 pg (ref 27.0–33.0)
MCHC: 32.2 g/dL (ref 32.0–36.0)
MCV: 86.7 fL (ref 80.0–100.0)
MPV: 9.2 fL (ref 7.5–12.5)
Monocytes Relative: 10.2 %
Neutro Abs: 2813 cells/uL (ref 1500–7800)
Neutrophils Relative %: 52.1 %
Platelets: 350 10*3/uL (ref 140–400)
RBC: 4.58 10*6/uL (ref 3.80–5.10)
RDW: 13.4 % (ref 11.0–15.0)
Total Lymphocyte: 35.1 %
WBC: 5.4 10*3/uL (ref 3.8–10.8)

## 2021-04-09 LAB — VITAMIN D 25 HYDROXY (VIT D DEFICIENCY, FRACTURES): Vit D, 25-Hydroxy: 28 ng/mL — ABNORMAL LOW (ref 30–100)

## 2021-04-09 LAB — HEMOGLOBIN A1C
Hgb A1c MFr Bld: 4.8 % of total Hgb (ref <5.7)
Mean Plasma Glucose: 91 mg/dL
eAG (mmol/L): 5 mmol/L

## 2021-04-09 NOTE — Progress Notes (Signed)
============================================================ -   Test results slightly outside the reference range are not unusual. If there is anything important, I will review this with you,  otherwise it is considered normal test values.  If you have further questions,  please do not hesitate to contact me at the office or via My Chart.  ============================================================ ============================================================  -  All Labs -  [facility]  !  Except  . . . . . . . ============================================================ ============================================================  -  CBC - Perfectly Normal   - Red cell count - Hgb is Great !  - WBC count is low & all types of white cells are Normal - None to suggest early Viral Infection  -Platelet count - clotting factors are all Normal   ============================================================ ============================================================  -  CMET is perfectly Normal  ===>>> Glucose- Kidneys - Electrolytes - Liver -  all OK No sign of liver inflammation ~ like Hepatitis  ============================================================ ============================================================  -  A1c is normal,  ruling out Diabetes - Great ! ============================================================ ============================================================  - Vitamin D = 28 !  - Vitamin D goal is between 70-100  !  0~0~0~0~0~0~0~0~0~0~0~0~0~0~0~0~0~0~0~0~0~0~0~0~0~0~0~0~0~0~0 - Please Take  Vitamin D 5,000 unit capsule DAILY    0~0~0~0~0~0~0~0~0~0~0~0~0~0~0~0~0~0~0~0~0~0~0~0~0~0~0~0~0~0~0  - It is very important as a natural anti-inflammatory and helping the  immune system protect against viral infections, like the Covid-19    helping hair, skin, and nails, as well as reducing stroke and  heart attack risk.   - It helps your bones and helps with  mood.  - It also decreases numerous cancer risks so please  take it as directed.   - Low Vit D is associated with a 200-300% higher risk for  CANCER   and 200-300% higher risk for HEART   ATTACK  &  STROKE.    - It is also associated with higher death rate at younger ages,   autoimmune diseases like Rheumatoid arthritis, Lupus,  Multiple Sclerosis.     - Also many other serious conditions, like depression, Alzheimer's  Dementia, infertility, muscle aches, fatigue, fibromyalgia   - just to name a few. ============================================================ ============================================================

## 2021-04-09 NOTE — Telephone Encounter (Signed)
LVM for patient to return call regarding labs.  °

## 2021-04-09 NOTE — Telephone Encounter (Signed)
The patient returned call to office and was updated with her lab results and recommendations. She was advised to please contact this office with any further questions, concerns or worsening symptoms. The patient voiced verbal understanding to this staff member.

## 2021-04-15 NOTE — Progress Notes (Deleted)
Assessment and Plan:  There are no diagnoses linked to this encounter.    Further disposition pending results of labs. Discussed med's effects and SE's.   Over 30 minutes of exam, counseling, chart review, and critical decision making was performed.   Future Appointments  Date Time Provider Department Center  04/16/2021  2:00 PM Revonda Humphrey, NP [facility] None  02/25/2022  9:00 AM Revonda Humphrey, NP [facility] None    ------------------------------------------------------------------------------------------------------------------   HPI There were no vitals taken for this visit. 25 y.o.female presents for  Past Medical History:  Diagnosis Date   Allergic rhinitis 07/19/2018     Allergies  Allergen Reactions   Amoxicillin Hives    Current Outpatient Medications on File Prior to Visit  Medication Sig   albuterol (VENTOLIN HFA) 108 (90 Base) MCG/ACT inhaler Inhale 2 puffs into the lungs every 4 (four) hours as needed for wheezing or shortness of breath.   cetirizine (ZYRTEC) 10 MG tablet Take 10 mg by mouth daily.   ferrous sulfate 325 (65 FE) MG tablet Take 325 mg by mouth daily with breakfast.   fluticasone (FLONASE) 50 MCG/ACT nasal spray Place 2 sprays into both nostrils at bedtime.   ibuprofen (ADVIL) 800 MG tablet Take 1 tablet (800 mg total) by mouth every 8 (eight) hours as needed for cramping. (Patient not taking: Reported on 04/08/2021)   Multiple Vitamins-Minerals (MULTIVITAMIN PO) Take by mouth daily.   No current facility-administered medications on file prior to visit.    ROS: all negative except above.   Physical Exam:  There were no vitals taken for this visit.  General Appearance: Well nourished, in no apparent distress. Eyes: PERRLA, EOMs, conjunctiva no swelling or erythema Sinuses: No Frontal/maxillary tenderness ENT/Mouth: Ext aud canals clear, TMs without erythema, bulging. No erythema, swelling, or exudate on post pharynx.  Tonsils not swollen or  erythematous. Hearing normal.  Neck: Supple, thyroid normal.  Respiratory: Respiratory effort normal, BS equal bilaterally without rales, rhonchi, wheezing or stridor.  Cardio: RRR with no MRGs. Brisk peripheral pulses without edema.  Abdomen: Soft, + BS.  Non tender, no guarding, rebound, hernias, masses. Lymphatics: Non tender without lymphadenopathy.  Musculoskeletal: Full ROM, 5/5 strength, normal gait.  Skin: Warm, dry without rashes, lesions, ecchymosis.  Neuro: Cranial nerves intact. Normal muscle tone, no cerebellar symptoms. Sensation intact.  Psych: Awake and oriented X 3, normal affect, Insight and Judgment appropriate.     Revonda Humphrey, NP 4:01 PM [facility]

## 2021-04-16 ENCOUNTER — Ambulatory Visit (INDEPENDENT_AMBULATORY_CARE_PROVIDER_SITE_OTHER): Payer: BC Managed Care – PPO | Admitting: Adult Health

## 2021-04-16 ENCOUNTER — Encounter: Payer: Self-pay | Admitting: Adult Health

## 2021-04-16 ENCOUNTER — Ambulatory Visit: Payer: BC Managed Care – PPO | Admitting: Nurse Practitioner

## 2021-04-16 ENCOUNTER — Other Ambulatory Visit: Payer: Self-pay

## 2021-04-16 VITALS — HR 91 | Temp 99.0°F

## 2021-04-16 DIAGNOSIS — Z1152 Encounter for screening for COVID-19: Secondary | ICD-10-CM

## 2021-04-16 LAB — POC COVID19 BINAXNOW: SARS Coronavirus 2 Ag: POSITIVE — AB

## 2021-04-16 MED ORDER — PREDNISONE 20 MG PO TABS: ORAL_TABLET | ORAL | 0 refills | Status: DC

## 2021-04-16 NOTE — Progress Notes (Signed)
THIS ENCOUNTER IS A VIRTUAL VISIT DUE TO COVID-19 - PATIENT WAS NOT SEEN IN THE OFFICE.  PATIENT HAS CONSENTED TO VIRTUAL VISIT / TELEMEDICINE VISIT   Virtual Visit via telephone Note  I connected with  Natasha Woods on 04/16/2021 by telephone.  I verified that I am speaking with the correct person using two identifiers.    I discussed the limitations of evaluation and management by telemedicine and the availability of in person appointments. The patient expressed understanding and agreed to proceed.  History of Present Illness:  Pulse 91   Temp 99 F (37.2 C)   SpO2 97%  25 y.o. patient contacted office reporting URI sx. she tested positive by rapid screen upon arrival. OV was converted to telephone to minimize exposure. This patient was vaccinated for covid 19, last booster 07/2020.   Sx began 5 days ago, sinus headache, started after working in yard and had some eye irritation, assumed allergies/sinus infection, tried allergies and sudafed treatment without significant improvement and presented for evaluation. Prone to sinus infections, 2-3/year.   Treatments tried so far: saline irrigations, sudafed, antihistamine  Exposures: works as a Runner, broadcasting/film/video   Medications    Current Outpatient Medications (Respiratory):    albuterol (VENTOLIN HFA) 108 (90 Base) MCG/ACT inhaler, Inhale 2 puffs into the lungs every 4 (four) hours as needed for wheezing or shortness of breath.   cetirizine (ZYRTEC) 10 MG tablet, Take 10 mg by mouth daily.   fluticasone (FLONASE) 50 MCG/ACT nasal spray, Place 2 sprays into both nostrils at bedtime.  Current Outpatient Medications (Analgesics):    ibuprofen (ADVIL) 800 MG tablet, Take 1 tablet (800 mg total) by mouth every 8 (eight) hours as needed for cramping. (Patient not taking: Reported on 04/08/2021)  Current Outpatient Medications (Hematological):    ferrous sulfate 325 (65 FE) MG tablet, Take 325 mg by mouth daily with breakfast.  Current Outpatient  Medications (Other):    Multiple Vitamins-Minerals (MULTIVITAMIN PO), Take by mouth daily.  Allergies:  Allergies  Allergen Reactions   Amoxicillin Hives    Problem list She has Allergic rhinitis; Menorrhagia with regular cycle; Vitamin D deficiency; Medication management; B12 deficiency; Abnormal vaginal bleeding; and Acute blood loss anemia on their problem list.   Social History:   reports that she has never smoked. She has never used smokeless tobacco. She reports current alcohol use. She reports that she does not use drugs.  Observations/Objective:  General : Well sounding patient in no apparent distress HEENT: no hoarseness, no cough for duration of visit Lungs: speaks in complete sentences, no audible wheezing, no apparent distress Neurological: alert, oriented x 3 Psychiatric: pleasant, judgement appropriate   Assessment and Plan:  Covid 19 Covid 19 positive per rapid screening test by drive through Risk factors include: none Symptoms are: mild Sx supportive therapy suggested Mainly congestion/sinus tenderness - saline irrigations, sudafed, sent in steroid taper after disucssion Take tylenol PRN temp 101+ Push hydration Abx not indicated with suspected viral etiology - only if persistent/not improving, or with rebound sx Follow up via mychart or telephone if needed Should remain in isolation until at least 5 days from onset of sx, 24-48 hours fever free without tylenol, sx such as cough are improved.   Follow Up Instructions:  I discussed the assessment and treatment plan with the patient. The patient was provided an opportunity to ask questions and all were answered. The patient agreed with the plan and demonstrated an understanding of the instructions.   The patient was advised  to call back or seek an in-person evaluation if the symptoms worsen or if the condition fails to improve as anticipated.  I provided 20 minutes of non-face-to-face time during this  encounter.   Dan Maker, NP

## 2021-05-29 ENCOUNTER — Encounter: Payer: Self-pay | Admitting: Internal Medicine

## 2021-05-29 ENCOUNTER — Other Ambulatory Visit: Payer: Self-pay

## 2021-05-29 ENCOUNTER — Ambulatory Visit (INDEPENDENT_AMBULATORY_CARE_PROVIDER_SITE_OTHER): Payer: BC Managed Care – PPO | Admitting: Internal Medicine

## 2021-05-29 VITALS — BP 100/70 | HR 97 | Temp 97.7°F | Resp 16 | Ht 64.0 in | Wt 136.2 lb

## 2021-05-29 DIAGNOSIS — H6523 Chronic serous otitis media, bilateral: Secondary | ICD-10-CM | POA: Diagnosis not present

## 2021-05-29 MED ORDER — PSEUDOEPHEDRINE HCL ER 120 MG PO TB12: ORAL_TABLET | ORAL | 3 refills | Status: AC

## 2021-05-29 MED ORDER — DEXAMETHASONE 4 MG PO TABS: ORAL_TABLET | ORAL | 0 refills | Status: DC

## 2021-05-29 NOTE — Progress Notes (Signed)
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 No fever, chills, sweats or rash.    Medications    albuterol (VENTOLIN HFA) 108 (90 Base) MCG/ACT inhaler, Inhale 2 puffs into the lungs every 4 (four) hours as needed for wheezing or shortness of breath.   cetirizine (ZYRTEC) 10 MG tablet, Take 10 mg by mouth daily.   fluticasone (FLONASE) 50 MCG/ACT nasal spray, Place 2 sprays into both nostrils at bedtime.   ibuprofen (ADVIL) 800 MG tablet, Take 1 tablet (800 mg total) by mouth every 8 (eight) hours as needed for cramping. Current Outpatient Medications (Hematological):    ferrous sulfate 325 (65 FE) MG tablet, Take 325 mg by mouth daily with breakfast. Patient thinks she is taking 65mg  now   Multiple Vitamins-Minerals (MULTIVITAMIN PO), Take by mouth daily.  Problem list She has Allergic rhinitis; Menorrhagia with regular cycle; Vitamin D deficiency; Medication management; B12 deficiency; Abnormal vaginal bleeding; and Acute blood loss anemia on their problem list.   Observations/Objective:  BP 100/70   Pulse 97   Temp 97.7 F (36.5 C)   Resp 16   Wt 136 lb 3.2 oz (61.8 kg)   SpO2 99%   BMI 22.66 kg/m   HEENT -  EACs patent  /TMs retracted. (+) Frontal & Maxillary tenderness.  N/O/P - clear Neck - supple. Nl T . No sig Nodes.  Chest - Clear equal BS. Cor - Nl HS. RRR w/o sig MGR. PP 1(+). No edema. MS- FROM w/o deformities.  Gait Nl. Neuro -  Nl w/o focal abnormalities.  Assessment and Plan:  1. Bilateral chronic serous otitis media   - dexamethasone  4 MG tablet;  Take 1 tab 3 x day - 3 days, then 2 x day - 3 days, then 1 tab daily   Dispense: 20 tablet  - pseudoephedrine  120 MG 12 hr tablet;  Take  1 tablet  2 x /day (every  12 hours)  for Sinus & Ear Congestion   Dispense: 60 tablet; Refill: 3   Follow Up Instructions:        I discussed the assessment and treatment plan with the patient. The patient was provided an opportunity to ask questions and all were answered. The patient agreed with the plan and demonstrated an understanding of the instructions.       The patient was advised to call back or seek an in-person evaluation if the symptoms worsen or if the condition fails to improve as anticipated.    , MD

## 2021-09-22 IMAGING — CT CT ABD-PELV W/ CM
2 of 4 series · 15 of 46 positions shown, 17 images · IV contrast (APPLIED)
Comparison: Contemporary pelvic ultrasound

CLINICAL DATA: Right lower quadrant abdominal pain

EXAM:
CT ABDOMEN AND PELVIS WITH CONTRAST
TECHNIQUE: Multidetector CT imaging of the abdomen and pelvis was performed
using the standard protocol following bolus administration of
intravenous contrast.
CONTRAST:  100mL OMNIPAQUE IOHEXOL 300 MG/ML  SOLN

[Series 2: abd pel w · axial · 0.64mm/px · z∈[+743,+1128]mm · 12 of 85 slices shown, 14 images]
[im 4/85  soft-tissue]
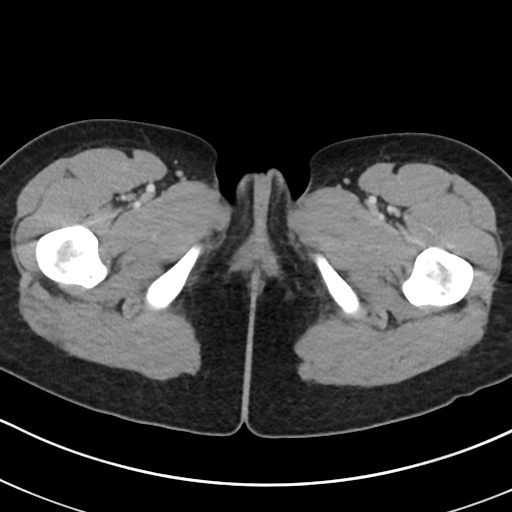
[im 4/85  bone]
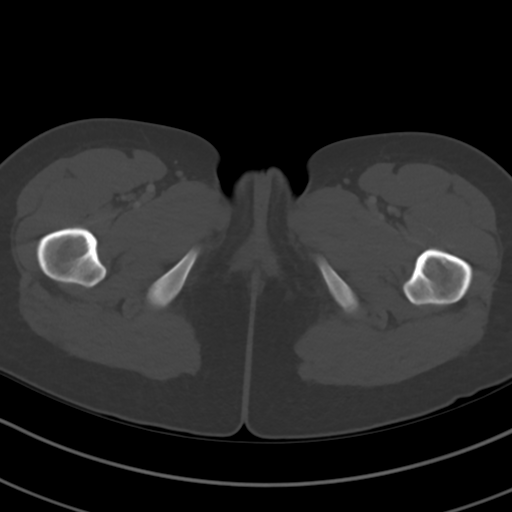
[im 11/85  soft-tissue]
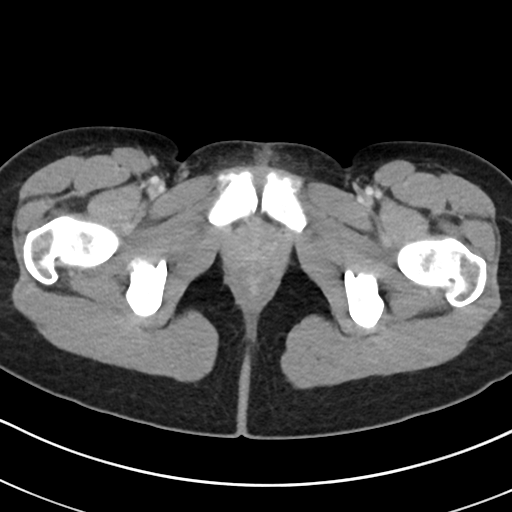
[im 18/85  soft-tissue]
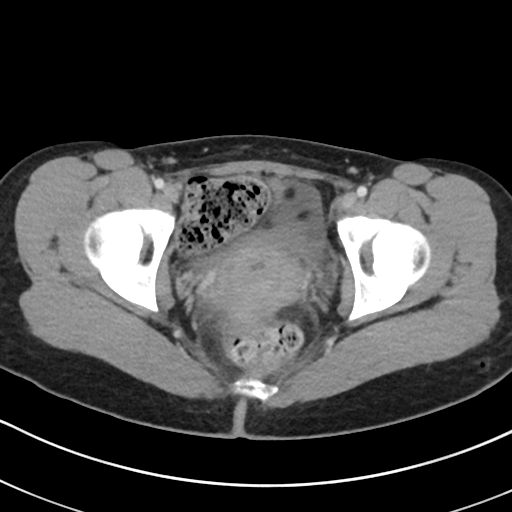
[im 25/85  soft-tissue]
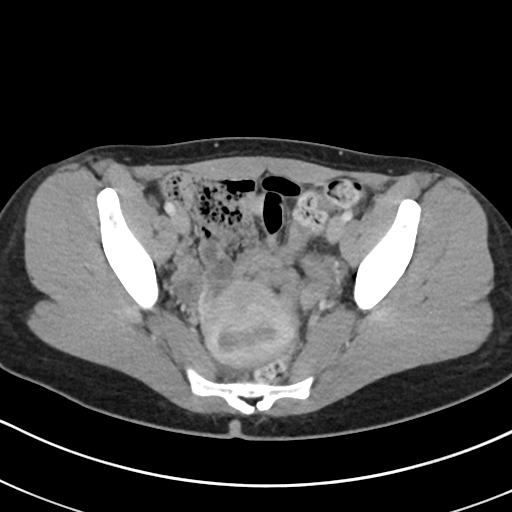
[im 32/85  soft-tissue]
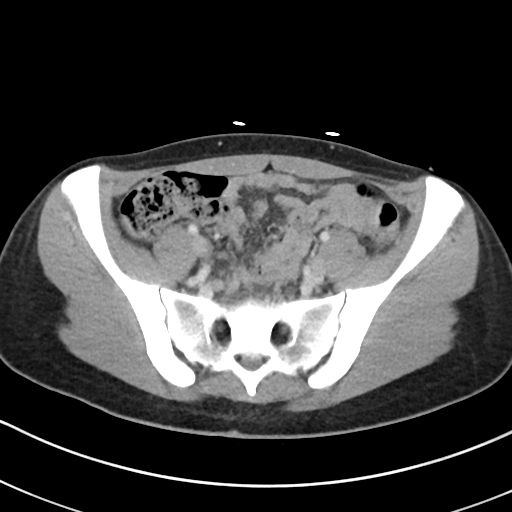
[im 39/85  soft-tissue]
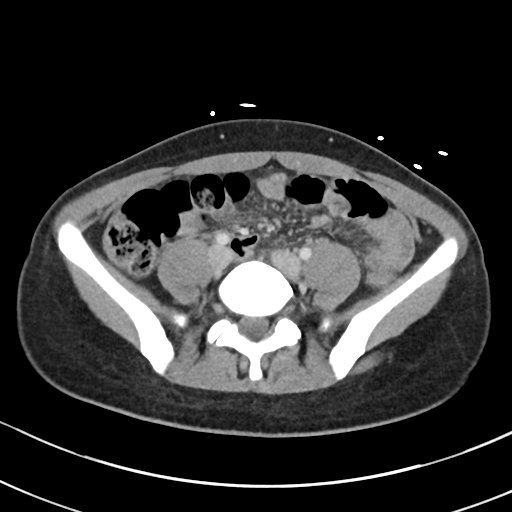
[im 46/85  soft-tissue]
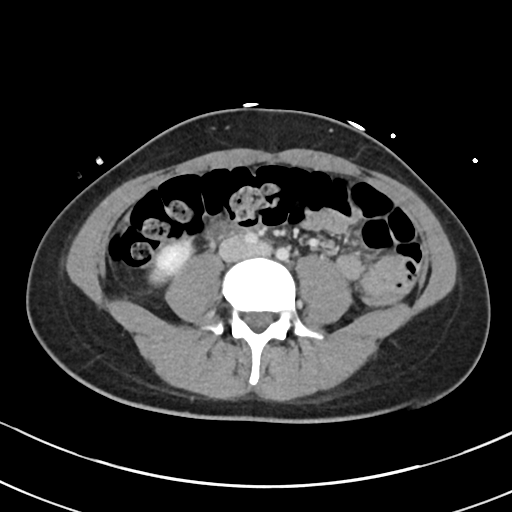
[im 53/85  soft-tissue]
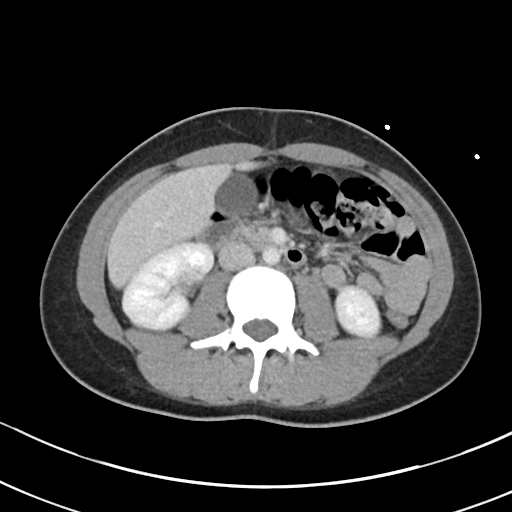
[im 60/85  soft-tissue]
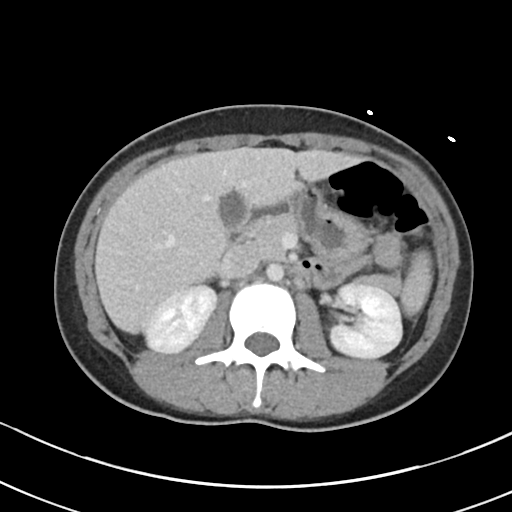
[im 60/85  bone]
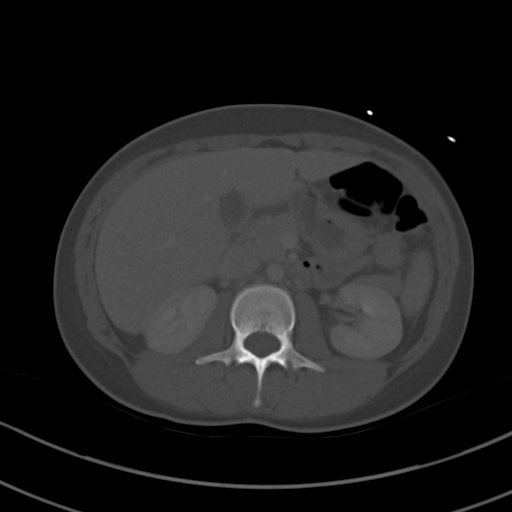
[im 67/85  soft-tissue]
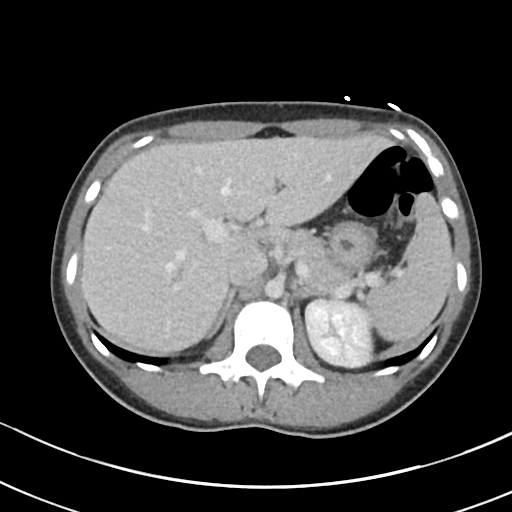
[im 74/85  soft-tissue]
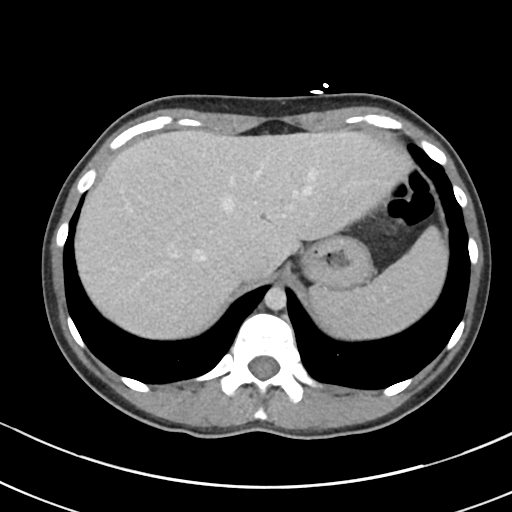
[im 81/85  soft-tissue]
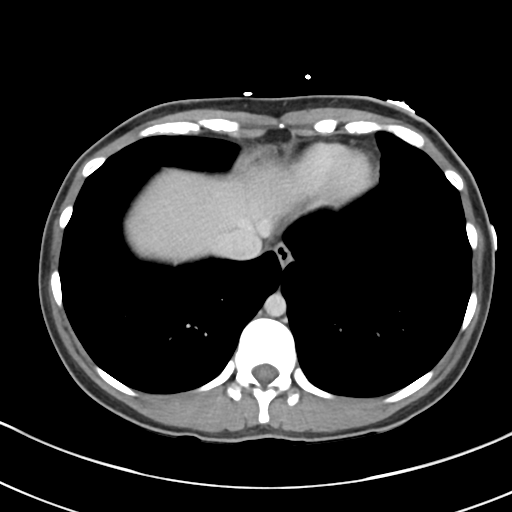

[Series 5: coronal · coronal · 0.66mm/px · 3 of 69 slices shown]
[im 23/69  soft-tissue]
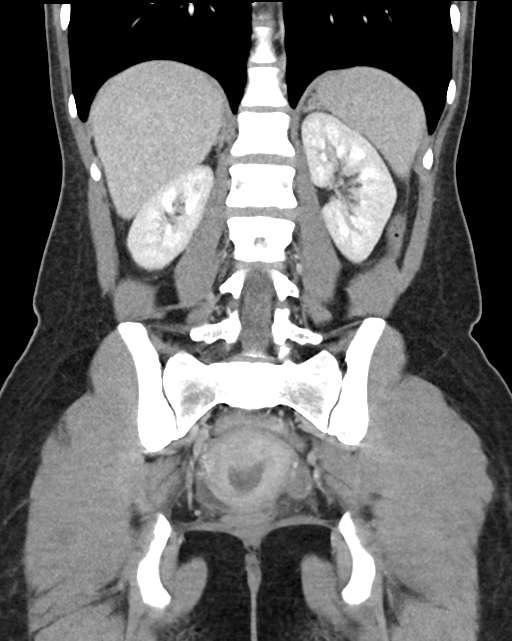
[im 31/69  soft-tissue]
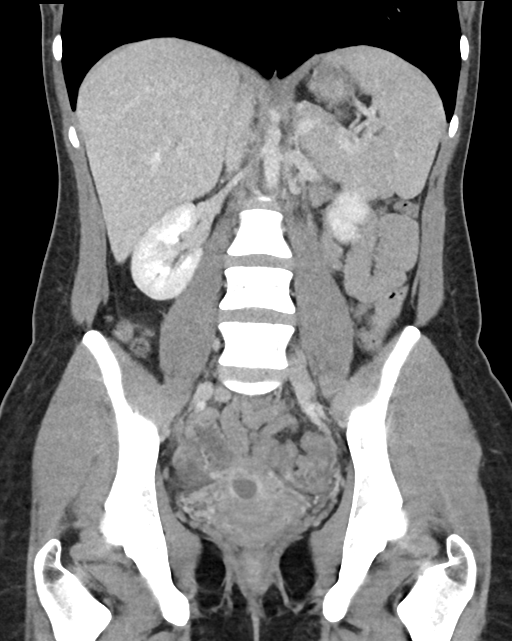
[im 38/69  soft-tissue]
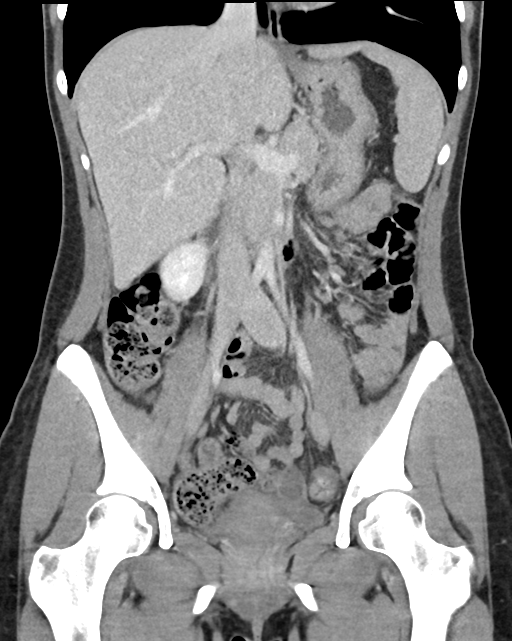

[15 of 46 positions shown; findings below may reference images not displayed]

FINDINGS: Lower chest: Tiny 3 mm subpleural nodule in the left lower lobe.
Lung bases are clear. Normal heart size. No pericardial effusion.

Hepatobiliary: No worrisome focal liver abnormality is seen. Normal
gallbladder. No visible calcified gallstones. No biliary ductal
dilatation.

Pancreas: No pancreatic ductal dilatation or surrounding
inflammatory changes.

Spleen: Normal in size. No concerning splenic lesions.

Adrenals/Urinary Tract: Normal adrenals. Kidneys are normally
located with symmetric enhancement. No suspicious renal lesion,
urolithiasis or hydronephrosis. Urinary bladder is largely
decompressed at the time of exam and therefore poorly evaluated by
CT imaging. Mild bladder wall thickening is nonspecific given
underdistention.

Stomach/Bowel: Distal esophagus, stomach and duodenum are
unremarkable. No small bowel thickening or dilatation. Cecum is
displaced towards the low pelvis. Normal appearing appendix courses
in a retrocecal position superiorly from the cecal tip. No focal
periappendiceal inflammation. No colonic dilatation or wall
thickening.

Vascular/Lymphatic: No significant vascular findings are present. No
enlarged abdominal or pelvic lymph nodes.

Reproductive: Retroverted uterus with marked endometrial thickening.
Better assessed on comparison pelvic ultrasound. No concerning
adnexal lesion.

Other: Small volume simple attenuation free fluid in the deep
pelvis. Nonspecific and often physiologic in a reproductive age
female.

Musculoskeletal: No acute osseous abnormality or suspicious osseous
lesion.
IMPRESSION: 1. Normal appendix.
2. Retroverted uterus with marked endometrial thickening. Better
assessed on comparison pelvic ultrasound.
3. Small volume simple attenuation free fluid in the deep pelvis,
nonspecific and often physiologic in a reproductive age female.
4. Mild bladder wall thickening can be secondary to underdistention.
Correlate with urinalysis to exclude cystitis.
5. Tiny 3 mm subpleural nodule in the left lower lobe. Most
consistent with a benign subpleural lymph node or postinflammatory
finding in a patient of this age.

## 2021-12-04 ENCOUNTER — Ambulatory Visit (INDEPENDENT_AMBULATORY_CARE_PROVIDER_SITE_OTHER): Payer: BC Managed Care – PPO | Admitting: Adult Health

## 2021-12-04 ENCOUNTER — Encounter: Payer: Self-pay | Admitting: Adult Health

## 2021-12-04 VITALS — BP 102/70 | HR 102 | Temp 98.5°F | Wt 136.0 lb

## 2021-12-04 DIAGNOSIS — R42 Dizziness and giddiness: Secondary | ICD-10-CM

## 2021-12-04 DIAGNOSIS — J0100 Acute maxillary sinusitis, unspecified: Secondary | ICD-10-CM

## 2021-12-04 MED ORDER — DEXAMETHASONE 1 MG PO TABS: ORAL_TABLET | ORAL | 0 refills | Status: DC

## 2021-12-04 MED ORDER — ONDANSETRON HCL 4 MG PO TABS: 4.0000 mg | ORAL_TABLET | Freq: Three times a day (TID) | ORAL | 0 refills | Status: DC | PRN

## 2021-12-04 NOTE — Patient Instructions (Signed)
Vertigo Vertigo is the feeling that you or your surroundings are moving when they are not. This feeling can come and go at any time. Vertigo often goes away on its own. Vertigo can be dangerous if it occurs while you are doing something that could endanger yourself or others, such as driving or operating machinery. Your health care provider will do tests to try to determine the cause of your vertigo. Tests will also help your health care provider decide how best to treat your condition. Follow these instructions at home: Eating and drinking     Dehydration can make vertigo worse. Drink enough fluid to keep your urine pale yellow. Do not drink alcohol. Activity Return to your normal activities as told by your health care provider. Ask your health care provider what activities are safe for you. In the morning, first sit up on the side of the bed. When you feel okay, stand slowly while you hold onto something until you know that your balance is fine. Move slowly. Avoid sudden body or head movements or certain positions, as told by your health care provider. If you have trouble walking or keeping your balance, try using a cane for stability. If you feel dizzy or unstable, sit down right away. Avoid doing any tasks that would cause danger to you or others if vertigo occurs. Avoid bending down if you feel dizzy. Place items in your home so that they are easy for you to reach without bending or leaning over. Do not drive or use machinery if you feel dizzy. General instructions Take over-the-counter and prescription medicines only as told by your health care provider. Keep all follow-up visits. This is important. Contact a health care provider if: Your medicines do not relieve your vertigo or they make it worse. Your condition gets worse or you develop new symptoms. You have a fever. You develop nausea or vomiting, or if nausea gets worse. Your family or friends notice any behavioral changes. You  have numbness or a prickling and tingling sensation in part of your body. Get help right away if you: Are always dizzy or you faint. Develop severe headaches. Develop a stiff neck. Develop sensitivity to light. Have difficulty moving or speaking. Have weakness in your hands, arms, or legs. Have changes in your hearing or vision. These symptoms may represent a serious problem that is an emergency. Do not wait to see if the symptoms will go away. Get medical help right away. Call your local emergency services (911 in the [place] Do not drive yourself to the hospital. Summary Vertigo is the feeling that you or your surroundings are moving when they are not. Your health care provider will do tests to try to determine the cause of your vertigo. Follow instructions for home care. You may be told to avoid certain tasks, positions, or movements. Contact a health care provider if your medicines do not relieve your symptoms, or if you have a fever, nausea, vomiting, or changes in behavior. Get help right away if you have severe headaches or difficulty speaking, or you develop hearing or vision problems. This information is not intended to replace advice given to you by your health care provider. Make sure you discuss any questions you have with your health care provider. Document Revised: 06/13/2020 Document Reviewed: 06/13/2020 Elsevier Patient Education  2023 [employer]  

## 2021-12-04 NOTE — Progress Notes (Signed)
Assessment and Plan: ? ?Natasha Woods was seen today for acute visit. ? ?Diagnoses and all orders for this visit: ? ?Acute non-recurrent maxillary sinusitis ?Early sx, avoid abx for now, will treat with steroid taper  ?Suggested symptomatic OTC remedies. ?Nasal saline spray for congestion. ?Nasal steroids, allergy pill, oral steroids offered ?Follow up if not improving or with any fever/chills, worsening HA or sinus sx, if persistent longer than 10 days may require abx ? ?Vertigo ?Suspect secondary to URI/allergies/inner ear, try steroid taper ?Follow up as needed if not improving or any worsening sx, HA, vision changes, fever chills. Etc.  ? ?Other orders ?-     ondansetron (ZOFRAN) 4 MG tablet; Take 1 tablet (4 mg total) by mouth every 8 (eight) hours as needed for nausea. ? ? ?Further disposition pending results of labs. Discussed med's effects and SE's.   ?Over 20 minutes of exam, counseling, chart review, and critical decision making was performed.  ? ?Future Appointments  ?Date Time Provider Department Center  ?02/25/2022  9:00 AM Mull, Loma Sousa, NP [facility] None  ? ? ?------------------------------------------------------------------------------------------------------------------ ? ? ?HPI ?Pulse (!) 102   Temp 98.5 ?F (36.9 ?C)   Wt 136 lb (61.7 kg)   LMP 12/03/2021   SpO2 99%   BMI 23.34 kg/m?  ?26 y.o.female with hx of allergic rhinitis presents for evaluation of dizziness/vertigo x 1 day, reports sense of room spinning even when lying flat, started last night. Dizziness worse after rotating head in either direction.  ? ?She reports mild sense of bil maxillary pressure despite taking flonase. Otherwise denies notable allergy or URI sx. Reports tested negative for covid 19 today. Denies fever/chills. Reprots mild HA "like my usual sinus infections." Denies nasal congestion, rhinitis. Some blurry vision after moving head, once dizziness resolves this improves.  ? ?Past Medical History:  ?Diagnosis Date  ?  Allergic rhinitis 07/19/2018  ?  ? ?Allergies  ?Allergen Reactions  ? Amoxicillin Hives  ? ? ?Current Outpatient Medications on File Prior to Visit  ?Medication Sig  ? albuterol (VENTOLIN HFA) 108 (90 Base) MCG/ACT inhaler Inhale 2 puffs into the lungs every 4 (four) hours as needed for wheezing or shortness of breath.  ? cetirizine (ZYRTEC) 10 MG tablet Take 10 mg by mouth daily.  ? ferrous sulfate 325 (65 FE) MG tablet Take 325 mg by mouth daily with breakfast. Patient thinks she is taking 65mg  now  ? fluticasone (FLONASE) 50 MCG/ACT nasal spray Place 2 sprays into both nostrils at bedtime.  ? ibuprofen (ADVIL) 800 MG tablet Take 1 tablet (800 mg total) by mouth every 8 (eight) hours as needed for cramping.  ? Multiple Vitamins-Minerals (MULTIVITAMIN PO) Take by mouth daily.  ? pseudoephedrine (SUDAFED) 120 MG 12 hr tablet Take  1 tablet  2 x /day (every 12 hours)  for Sinus & Ear Congestion  ? dexamethasone (DECADRON) 4 MG tablet Take 1 tab 3 x day - 3 days, then 2 x day - 3 days, then 1 tab daily  ? ?No current facility-administered medications on file prior to visit.  ? ? ?ROS: all negative except above.  ? ?Physical Exam: ? ?Pulse (!) 102   Temp 98.5 ?F (36.9 ?C)   Wt 136 lb (61.7 kg)   LMP 12/03/2021   SpO2 99%   BMI 23.34 kg/m?  ? ?General Appearance: Well nourished, in no apparent distress. ?Eyes: PERRLA, EOMs, conjunctiva no swelling or erythema ?Sinuses: No Frontal tenderness, + bil maxillary tenderness ?ENT/Mouth: Ext aud canals clear, TMs  without erythema, bulging. No erythema, swelling, or exudate on post pharynx.  Tonsils not swollen or erythematous. Hearing normal.  ?Neck: Supple, thyroid normal. Dizziness elicited with sudden head movement in any direction. Denies with slow neck extension.  ?Respiratory: Respiratory effort normal, BS equal bilaterally without rales, rhonchi, wheezing or stridor.  ?Cardio: RRR with no MRGs.  ?Lymphatics: Non tender without lymphadenopathy.  ?Musculoskeletal:  normal gait. ?Skin: Warm, dry without rashes, lesions, ecchymosis.  ?Neuro: Cranial nerves intact. Normal muscle tone, no cerebellar symptoms. Sensation intact.  ?Psych: Awake and oriented X 3, normal affect, Insight and Judgment appropriate.  ? ?Dan Maker, NP ?11:21 AM ?[facility] ? ?

## 2022-02-24 NOTE — Progress Notes (Deleted)
 Complete Physical  Assessment and Plan:  Natasha Woods was seen today for annual exam.  Diagnoses and all orders for this visit:  Encounter for routine adult health examination without abnormal findings  Discussed STD testing, safe sex, alcohol and drug awareness, drinking and driving dangers, wearing a seat belt and general safety measures for young adult. - not sexually active, no needs for PAP at this time -     CBC with Differential/Platelet -     COMPLETE METABOLIC PANEL WITH GFR -     Lipid panel -     TSH  Medication management -     CBC with Differential/Platelet -     COMPLETE METABOLIC PANEL WITH GFR -     Lipid panel -     TSH  Screening for thyroid disorder -     TSH  Screening for deficiency anemia -     CBC with Differential/Platelet  BMI 22.0-22.9, adult       - Continue with diet high in fruits/vegetables and fiber, limiting saturated fats. Continue regular exercise.   Mixed hyperlipidemia Continue diet and exercise -     Lipid panel  Menometrorrhagia -     CBC with Differential/Platelet - Monitor menstrual cycles, if continues to have spotting many days of the month a low dose estrogen OC should control menstrual cycles   Vitamin D deficiency Continue Vit D supplementation to maintain value in therapeutic level of 60-100  - Vit D   Discussed med's effects and SE's. Screening labs and tests as requested with regular follow-up as recommended. Over 40 minutes of exam, counseling, chart review and critical decision making was performed  Future Appointments  Date Time Provider Department Center  02/25/2022  9:00 AM Raynelle Dick, NP [facility] None     HPI  [name] presents for complete physical. Natasha Woods has Allergic rhinitis; Menorrhagia with regular cycle; Vitamin D deficiency; Medication management; B12 deficiency; Abnormal vaginal bleeding; and Acute blood loss anemia on their problem list. Patient reports no complaints at this  time.   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 Natasha Woods bought a house and new car. Foster placement this week, may be getting 1 or 2 children. Dating but never sexually active, plans to wait until marriage.   Menses are abnormal and has had irregular bleeding with periods starting and stopping, normal cycle prior to OC last year was heavy bleeding for 6-7 days with more abdominal cramping.  Natasha Woods was on oral contraceptives previously to control her menstrual cycle and was taken off to see if her cycle was regulated.  Much lighter at this point but have spotting many days of the month  Has year round allergies, prone to severe sinus infections. Takes zyrtec, flonase, mucinex, etc with fair results.   BMI is There is no height or weight on file to calculate BMI., Natasha Woods has been working on diet and exercise, walking with her friend more since the pandemic, doing 3.5 miles over 1 hour or, 3-5 days a week.  Lots of water - 32 oz x 4 water daily minimum No caffeine,  Gets minimum 6 hours of sleep, prioritizes  Eats at home, estimates 2-3 servings of veggies, 3-4 servings of fruit daily  Leans meats mostly, not much processed  No alcohol  Wt Readings from Last 3 Encounters:  12/04/21 136 lb (61.7 kg)  05/29/21 136 lb 3.2 oz (61.8 kg)  04/08/21 140 lb 3.2 oz (63.6 kg)    Her blood  pressure has been controlled at home, today their BP is    Natasha Woods does workout,  Natasha Woods denies chest pain, shortness of breath, dizziness.   Natasha Woods is not on cholesterol medication and denies myalgias. Her cholesterol is at goal. The cholesterol last visit was:   Lab Results  Component Value Date   CHOL 181 02/25/2021   HDL 59 02/25/2021   LDLCALC 106 (H) 02/25/2021   TRIG 74 02/25/2021   CHOLHDL 3.1 02/25/2021   Last S8N in the office was:  Lab Results  Component Value Date   HGBA1C 4.8 04/08/2021      Current Medications:  Current Outpatient Medications on File Prior to Visit  Medication Sig Dispense  Refill   albuterol (VENTOLIN HFA) 108 (90 Base) MCG/ACT inhaler Inhale 2 puffs into the lungs every 4 (four) hours as needed for wheezing or shortness of breath. 1 Inhaler 0   cetirizine (ZYRTEC) 10 MG tablet Take 10 mg by mouth daily.     dexamethasone (DECADRON) 1 MG tablet Take 3 tabs for 3 days, 2 tabs for 3 days 1 tab for 5 days. Take with food. 20 tablet 0   ferrous sulfate 325 (65 FE) MG tablet Take 325 mg by mouth daily with breakfast. Patient thinks Natasha Woods is taking 65mg  now     fluticasone (FLONASE) 50 MCG/ACT nasal spray Place 2 sprays into both nostrils at bedtime. 16 g 1   ibuprofen (ADVIL) 800 MG tablet Take 1 tablet (800 mg total) by mouth every 8 (eight) hours as needed for cramping. 90 tablet 0   Multiple Vitamins-Minerals (MULTIVITAMIN PO) Take by mouth daily.     ondansetron (ZOFRAN) 4 MG tablet Take 1 tablet (4 mg total) by mouth every 8 (eight) hours as needed for nausea. 30 tablet 0   pseudoephedrine (SUDAFED) 120 MG 12 hr tablet Take  1 tablet  2 x /day (every 12 hours)  for Sinus & Ear Congestion 60 tablet 3   No current facility-administered medications on file prior to visit.   Health Maintenance:   Immunization History  Administered Date(s) Administered   HPV 9-valent 03/15/2015, 05/28/2015, 09/06/2015   Meningococcal Conjugate 05/30/2014   PFIZER(Purple Top)SARS-COV-2 Vaccination 10/01/2019, 10/22/2019, 07/30/2020   PPD Test 10/19/2017, 02/16/2020   Tdap 07/28/2012    TD/TDAP: 2014 Influenza: 2019 on campus Pneumovax: N/A  Prevnar 13: N/A HPV vaccines: completed meningitis vaccine 2015 Covid 19: 2/2, 2021, pfizer   LMP: No LMP recorded. Sexually Active: no STD testing offered Pap: not needed at this time - never sexually active  MGM: N/A  Allergies:  Allergies  Allergen Reactions   Amoxicillin Hives   Medical History:  has Allergic rhinitis; Menorrhagia with regular cycle; Vitamin D deficiency; Medication management; B12 deficiency; Abnormal vaginal  bleeding; and Acute blood loss anemia on their problem list. Surgical History:  Natasha Woods  has a past surgical history that includes Wisdom tooth extraction (2016). Family History:  Her family history includes Cancer in her maternal grandfather and paternal grandfather; Dementia in her paternal grandmother; Hashimoto's thyroiditis in her sister; Irritable bowel syndrome in her maternal grandmother and paternal grandmother; Skin cancer in her maternal grandfather; Thyroid disease in her mother. Social History:   reports that Natasha Woods has never smoked. Natasha Woods has never used smokeless tobacco. Natasha Woods reports current alcohol use. Natasha Woods reports that Natasha Woods does not use drugs.  Review of Systems: Review of Systems  Constitutional: Negative.  Negative for chills, fever, malaise/fatigue and weight loss.  HENT: Negative.  Negative for congestion, hearing  loss, sinus pain, sore throat and tinnitus.   Eyes: Negative.  Negative for blurred vision and double vision.  Respiratory: Negative.  Negative for cough, hemoptysis, sputum production, shortness of breath and wheezing.   Cardiovascular: Negative.  Negative for chest pain, palpitations, orthopnea, claudication and leg swelling.  Gastrointestinal: Negative.  Negative for abdominal pain, blood in stool, constipation, diarrhea, heartburn, melena, nausea and vomiting.  Genitourinary: Negative.  Negative for dysuria and urgency.       Irregular spotting and cramping with periods  Musculoskeletal: Negative.  Negative for back pain, falls, joint pain, myalgias and neck pain.       Popping of right knee with movement  Skin: Negative.  Negative for rash.  Neurological:  Negative for dizziness, tingling, tremors, sensory change, weakness and headaches.  Endo/Heme/Allergies:  Negative for polydipsia. Does not bruise/bleed easily.  Psychiatric/Behavioral: Negative.  Negative for depression and suicidal ideas. The patient is not nervous/anxious and does not have insomnia.   All other  systems reviewed and are negative.   Physical Exam: Estimated body mass index is 23.34 kg/m as calculated from the following:   Height as of 05/29/21: 5\' 4"  (1.626 m).   Weight as of 12/04/21: 136 lb (61.7 kg). There were no vitals taken for this visit. General Appearance: Well nourished, in no apparent distress.  Eyes: PERRLA, EOMs, conjunctiva no swelling or erythema Sinuses: No Frontal/maxillary tenderness  ENT/Mouth: Ext aud canals clear, normal light reflex with TMs without erythema, bulging. Good dentition. No erythema, swelling, or exudate on post pharynx. Tonsils not swollen or erythematous. Hearing normal.  Neck: Supple, thyroid normal. No bruits  Respiratory: Respiratory effort normal, BS equal bilaterally without rales, rhonchi, wheezing or stridor.  Cardio: RRR without murmurs, rubs or gallops. Brisk peripheral pulses without edema.  Chest: symmetric, with normal excursions and percussion.  Breasts: Defer Abdomen: Soft, nontender, no guarding, rebound, hernias, masses, or organomegaly.  Lymphatics: Non tender without lymphadenopathy.  Genitourinary: defer Musculoskeletal: Full ROM all peripheral extremities,5/5 strength, and normal gait.  Popping sound of right knee with movement , nontender Skin: Warm, dry without rashes, lesions, ecchymosis. Neuro: Cranial nerves intact, reflexes equal bilaterally. Normal muscle tone, no cerebellar symptoms. Sensation intact.  Psych: Awake and oriented X 3, normal affect, Insight and Judgment appropriate.   EKG: defer  Kelvon Giannini E  9:38 AM [facility]

## 2022-02-25 ENCOUNTER — Encounter: Payer: BC Managed Care – PPO | Admitting: Nurse Practitioner

## 2022-05-14 ENCOUNTER — Encounter: Payer: Self-pay | Admitting: Internal Medicine

## 2022-05-14 ENCOUNTER — Other Ambulatory Visit: Payer: Self-pay | Admitting: Nurse Practitioner

## 2022-05-14 DIAGNOSIS — R6889 Other general symptoms and signs: Secondary | ICD-10-CM

## 2022-05-14 MED ORDER — ALBUTEROL SULFATE HFA 108 (90 BASE) MCG/ACT IN AERS: 2.0000 | INHALATION_SPRAY | RESPIRATORY_TRACT | 0 refills | Status: AC | PRN

## 2022-11-11 ENCOUNTER — Encounter: Payer: Self-pay | Admitting: Nurse Practitioner

## 2022-11-20 ENCOUNTER — Other Ambulatory Visit: Payer: Self-pay

## 2022-11-20 ENCOUNTER — Encounter (HOSPITAL_BASED_OUTPATIENT_CLINIC_OR_DEPARTMENT_OTHER): Payer: Self-pay | Admitting: Emergency Medicine

## 2022-11-20 ENCOUNTER — Other Ambulatory Visit (HOSPITAL_BASED_OUTPATIENT_CLINIC_OR_DEPARTMENT_OTHER): Payer: Self-pay

## 2022-11-20 ENCOUNTER — Emergency Department (HOSPITAL_BASED_OUTPATIENT_CLINIC_OR_DEPARTMENT_OTHER): Payer: BC Managed Care – PPO

## 2022-11-20 ENCOUNTER — Ambulatory Visit: Payer: BC Managed Care – PPO | Admitting: Nurse Practitioner

## 2022-11-20 ENCOUNTER — Emergency Department (HOSPITAL_BASED_OUTPATIENT_CLINIC_OR_DEPARTMENT_OTHER)
Admission: EM | Admit: 2022-11-20 | Discharge: 2022-11-20 | Disposition: A | Discharge status: Home / Self Care | Payer: BC Managed Care – PPO | Attending: Emergency Medicine | Admitting: Emergency Medicine

## 2022-11-20 DIAGNOSIS — D72829 Elevated white blood cell count, unspecified: Secondary | ICD-10-CM | POA: Diagnosis not present

## 2022-11-20 DIAGNOSIS — N12 Tubulo-interstitial nephritis, not specified as acute or chronic: Secondary | ICD-10-CM | POA: Insufficient documentation

## 2022-11-20 DIAGNOSIS — R1031 Right lower quadrant pain: Secondary | ICD-10-CM | POA: Diagnosis present

## 2022-11-20 LAB — COMPREHENSIVE METABOLIC PANEL
ALT: 13 U/L (ref 0–44)
AST: 17 U/L (ref 15–41)
Albumin: 4.1 g/dL (ref 3.5–5.0)
Alkaline Phosphatase: 53 U/L (ref 38–126)
Anion gap: 11 (ref 5–15)
BUN: 10 mg/dL (ref 6–20)
CO2: 22 mmol/L (ref 22–32)
Calcium: 9.2 mg/dL (ref 8.9–10.3)
Chloride: 102 mmol/L (ref 98–111)
Creatinine, Ser: 0.71 mg/dL (ref 0.44–1.00)
GFR, Estimated: >60 mL/min (ref >60)
Glucose, Bld: 78 mg/dL (ref 70–99)
Potassium: 3.7 mmol/L (ref 3.5–5.1)
Sodium: 135 mmol/L (ref 135–145)
Total Bilirubin: 0.6 mg/dL (ref 0.3–1.2)
Total Protein: 8 g/dL (ref 6.5–8.1)

## 2022-11-20 LAB — CBC WITH DIFFERENTIAL/PLATELET
Abs Immature Granulocytes: 0.09 10*3/uL — ABNORMAL HIGH (ref 0.00–0.07)
Basophils Absolute: 0 10*3/uL (ref 0.0–0.1)
Basophils Relative: 0 %
Eosinophils Absolute: 0 10*3/uL (ref 0.0–0.5)
Eosinophils Relative: 0 %
HCT: 37.7 % (ref 36.0–46.0)
Hemoglobin: 12.7 g/dL (ref 12.0–15.0)
Immature Granulocytes: 0 %
Lymphocytes Relative: 7 %
Lymphs Abs: 1.5 10*3/uL (ref 0.7–4.0)
MCH: 28.5 pg (ref 26.0–34.0)
MCHC: 33.7 g/dL (ref 30.0–36.0)
MCV: 84.5 fL (ref 80.0–100.0)
Monocytes Absolute: 1.4 10*3/uL — ABNORMAL HIGH (ref 0.1–1.0)
Monocytes Relative: 7 %
Neutro Abs: 17.7 10*3/uL — ABNORMAL HIGH (ref 1.7–7.7)
Neutrophils Relative %: 86 %
Platelets: 430 10*3/uL — ABNORMAL HIGH (ref 150–400)
RBC: 4.46 MIL/uL (ref 3.87–5.11)
RDW: 13 % (ref 11.5–15.5)
WBC: 20.7 10*3/uL — ABNORMAL HIGH (ref 4.0–10.5)
nRBC: 0 % (ref 0.0–0.2)

## 2022-11-20 LAB — URINALYSIS, ROUTINE W REFLEX MICROSCOPIC
Bacteria, UA: NONE SEEN
Bilirubin Urine: NEGATIVE
Glucose, UA: NEGATIVE mg/dL
Ketones, ur: >80 mg/dL — AB
Leukocytes,Ua: NEGATIVE
Nitrite: NEGATIVE
Protein, ur: NEGATIVE mg/dL
Specific Gravity, Urine: 1.041 — ABNORMAL HIGH (ref 1.005–1.030)
pH: 6 (ref 5.0–8.0)

## 2022-11-20 LAB — HCG, SERUM, QUALITATIVE: Preg, Serum: NEGATIVE

## 2022-11-20 LAB — LACTIC ACID, PLASMA
Lactic Acid, Venous: 1 mmol/L (ref 0.5–1.9)
Lactic Acid, Venous: 0.6 mmol/L (ref 0.5–1.9)

## 2022-11-20 MED ORDER — SULFAMETHOXAZOLE-TRIMETHOPRIM 800-160 MG PO TABS
1.0000 | ORAL_TABLET | Freq: Two times a day (BID) | ORAL | 0 refills | Status: AC
  Filled 2022-11-20: qty 28, 14d supply, fill #0

## 2022-11-20 MED ORDER — MORPHINE SULFATE (PF) 4 MG/ML IV SOLN
4.0000 mg | Freq: Once | INTRAVENOUS | Status: AC
  Administered 2022-11-20: 4 mg via INTRAVENOUS
  Filled 2022-11-20: qty 1

## 2022-11-20 MED ORDER — SODIUM CHLORIDE 0.9 % IV SOLN
2.0000 g | Freq: Once | INTRAVENOUS | Status: AC
  Administered 2022-11-20: 2 g via INTRAVENOUS
  Filled 2022-11-20: qty 20

## 2022-11-20 MED ORDER — ONDANSETRON HCL 4 MG/2ML IJ SOLN
4.0000 mg | Freq: Once | INTRAMUSCULAR | Status: AC
  Administered 2022-11-20: 4 mg via INTRAVENOUS
  Filled 2022-11-20: qty 2

## 2022-11-20 MED ORDER — IOHEXOL 300 MG/ML  SOLN
100.0000 mL | Freq: Once | INTRAMUSCULAR | Status: AC | PRN
  Administered 2022-11-20: 80 mL via INTRAVENOUS

## 2022-11-20 MED ORDER — SODIUM CHLORIDE 0.9 % IV BOLUS
1000.0000 mL | Freq: Once | INTRAVENOUS | Status: AC
  Administered 2022-11-20: 1000 mL via INTRAVENOUS

## 2022-11-20 MED ORDER — ACETAMINOPHEN 325 MG PO TABS
650.0000 mg | ORAL_TABLET | Freq: Once | ORAL | Status: AC | PRN
  Administered 2022-11-20: 650 mg via ORAL
  Filled 2022-11-20: qty 2

## 2022-11-20 MED ORDER — ONDANSETRON HCL 4 MG PO TABS
4.0000 mg | ORAL_TABLET | Freq: Four times a day (QID) | ORAL | 0 refills | Status: AC
  Filled 2022-11-20: qty 18, 21d supply, fill #0

## 2022-11-20 MED ORDER — NAPROXEN 375 MG PO TABS
375.0000 mg | ORAL_TABLET | Freq: Two times a day (BID) | ORAL | 0 refills | Status: AC
  Filled 2022-11-20: qty 14, 7d supply, fill #0

## 2022-11-20 NOTE — Discharge Instructions (Addendum)
You were given a prescription for antibiotics to help treat your kidney infection.  Please take 1 tablet twice a day for the next 14 days.  In addition, I prescribed occasions to help with your nausea, please take 1 tablet every 6 hours as needed.  You are also going home on a short course of anti-inflammatories, please take this as prescribed with food.  If you experience any nausea, vomiting, worsening symptoms you will need to return to the ED.

## 2022-11-20 NOTE — ED Notes (Addendum)
1 set of blood cultures collected, 1 red top collected.

## 2022-11-20 NOTE — ED Provider Notes (Signed)
[place] EMERGENCY DEPARTMENT AT [facility] Provider Note   CSN: [id number] Arrival date & time: 11/20/22  7829     History  Chief Complaint  Patient presents with   Abdominal Pain    Natasha Woods is a 27 y.o. female.  27 y.o female with no PMH presents to the ED with a chief pain of abdominal pain.  Symptoms have been ongoing for the past week, they started last week where she had a high fever with a Tmax of 104.  She reports ongoing sharp stabbing pain to the right lower quadrant, reports she thought this was likely her period cramps as she is currently on her menstrual cycle.  She states for the past 2 days the symptoms have now worsened.  She was evaluated by urgent care today and referred to the emergency department for a CT to rule out appendicitis.  She does report increase in oral intake, however has not been voiding as much as she usually does. She has been taking medication for pyrexia but pain persist. Symptoms are exacerbated with movement, placing her close on, putting her shoes on.  Her last oral intake was crackers last night.  She last had a bowel movement this morning. Does not have any prior history of surgical intervention to her abdomen.   The history is provided by the patient.  Abdominal Pain Pain location:  RLQ Pain quality: sharp   Pain radiates to:  Does not radiate Pain severity:  Moderate Associated symptoms: chills, diarrhea, fever, nausea and vomiting   Associated symptoms: no chest pain, no constipation, no shortness of breath and no sore throat        Home Medications Prior to Admission medications   Medication Sig Start Date End Date Taking? Authorizing Provider  naproxen (NAPROSYN) 375 MG tablet Take 1 tablet (375 mg total) by mouth 2 (two) times daily for 7 days. 11/20/22 11/27/22 Yes Ellias Mcelreath, PA-C  ondansetron (ZOFRAN) 4 MG tablet Take 1 tablet (4 mg total) by mouth every 6 (six) hours for 7 days. 11/20/22 12/11/22 Yes Melanie Pellot,  Leonie Douglas, PA-C  sulfamethoxazole-trimethoprim (BACTRIM DS) 800-160 MG tablet Take 1 tablet by mouth 2 (two) times daily for 14 days. 11/20/22 12/04/22 Yes Taj Nevins, Leonie Douglas, PA-C  albuterol (VENTOLIN HFA) 108 (90 Base) MCG/ACT inhaler Inhale 2 puffs into the lungs every 4 (four) hours as needed for wheezing or shortness of breath. 05/14/22   Raynelle Dick, NP  cetirizine (ZYRTEC) 10 MG tablet Take 10 mg by mouth daily.    [provider]  dexamethasone (DECADRON) 1 MG tablet Take 3 tabs for 3 days, 2 tabs for 3 days 1 tab for 5 days. Take with food. 12/04/21   Judd Gaudier, NP  ferrous sulfate 325 (65 FE) MG tablet Take 325 mg by mouth daily with breakfast. Patient thinks she is taking 65mg  now    [provider]  fluticasone (FLONASE) 50 MCG/ACT nasal spray Place 2 sprays into both nostrils at bedtime. 12/03/16   Doree Albee, PA-C  ibuprofen (ADVIL) 800 MG tablet Take 1 tablet (800 mg total) by mouth every 8 (eight) hours as needed for cramping. 11/29/18   Doree Albee, PA-C  Multiple Vitamins-Minerals (MULTIVITAMIN PO) Take by mouth daily.    [provider]  pseudoephedrine (SUDAFED) 120 MG 12 hr tablet Take  1 tablet  2 x /day (every 12 hours)  for Sinus & Ear Congestion 05/29/21   Lucky Cowboy, MD      Allergies  Amoxicillin    Review of Systems   Review of Systems  Constitutional:  Positive for chills and fever.  HENT:  Negative for sore throat.   Respiratory:  Negative for shortness of breath.   Cardiovascular:  Negative for chest pain.  Gastrointestinal:  Positive for abdominal pain, diarrhea, nausea and vomiting. Negative for blood in stool and constipation.  Genitourinary:  Negative for flank pain.  Musculoskeletal:  Negative for back pain.  Neurological:  Negative for light-headedness and headaches.  All other systems reviewed and are negative.   Physical Exam Updated Vital Signs BP (!) 95/56   Pulse 92   Temp 98.5 F (36.9 C)   Resp 18    Ht 5\' 4"  (1.626 m)   Wt 61.2 kg   LMP 11/15/2022   SpO2 100%   BMI 23.17 kg/m  Physical Exam Vitals and nursing note reviewed.  Constitutional:      Appearance: She is well-developed. She is ill-appearing.  HENT:     Head: Normocephalic and atraumatic.  Cardiovascular:     Rate and Rhythm: Normal rate.  Pulmonary:     Effort: Pulmonary effort is normal.     Breath sounds: No wheezing or rales.  Abdominal:     General: Abdomen is flat.     Palpations: Abdomen is soft.     Tenderness: There is abdominal tenderness in the right lower quadrant. There is right CVA tenderness and guarding. There is no left CVA tenderness. Positive signs include McBurney's sign.  Skin:    General: Skin is warm and dry.  Neurological:     Mental Status: She is alert and oriented to person, place, and time.     ED Results / Procedures / Treatments   Labs (all labs ordered are listed, but only abnormal results are displayed) Labs Reviewed  CBC WITH DIFFERENTIAL/PLATELET - Abnormal; Notable for the following components:      Result Value   WBC 20.7 (*)    Platelets 430 (*)    Neutro Abs 17.7 (*)    Monocytes Absolute 1.4 (*)    Abs Immature Granulocytes 0.09 (*)    All other components within normal limits  URINALYSIS, ROUTINE W REFLEX MICROSCOPIC - Abnormal; Notable for the following components:   Specific Gravity, Urine 1.041 (*)    Hgb urine dipstick MODERATE (*)    Ketones, ur >80 (*)    All other components within normal limits  URINE CULTURE  LACTIC ACID, PLASMA  LACTIC ACID, PLASMA  COMPREHENSIVE METABOLIC PANEL  HCG, SERUM, QUALITATIVE    EKG None  Radiology CT ABDOMEN PELVIS W CONTRAST  Result Date: 11/20/2022 CLINICAL DATA:  Right lower quadrant pain, flank pain for 2 days. EXAM: CT ABDOMEN AND PELVIS WITH CONTRAST TECHNIQUE: Multidetector CT imaging of the abdomen and pelvis was performed using the standard protocol following bolus administration of intravenous contrast.  RADIATION DOSE REDUCTION: This exam was performed according to the departmental dose-optimization program which includes automated exposure control, adjustment of the mA and/or kV according to patient size and/or use of iterative reconstruction technique. CONTRAST:  80mL OMNIPAQUE IOHEXOL 300 MG/ML  SOLN COMPARISON:  CT and ultrasound 11/14/2020 FINDINGS: Lower chest: Lung bases are clear. No pleural effusion. Stable 3 mm left lower lobe subpleural lung nodule demonstrating over 2 years of stability. No specific imaging follow-up. Hepatobiliary: No focal liver abnormality is seen. No gallstones, gallbladder wall thickening, or biliary dilatation. Pancreas: Unremarkable. No pancreatic ductal dilatation or surrounding inflammatory changes. Spleen: Small splenule. Otherwise preserved  splenic enhancement and size. Adrenals/Urinary Tract: The adrenal glands are preserved. Left kidney has preserved enhancement. No collecting system dilatation. Left ureter has a normal course and caliber down to the normal caliber bladder. The right kidney has perinephric stranding and areas of some wedge-shaped poor enhancement. No collecting system dilatation. The right ureter has a normal course and caliber down to the bladder. Please correlate for any clinical signs of infection and pyelonephritis. Stomach/Bowel: Large bowel has a normal course and caliber with scattered stool. Stomach is mildly distended with fluid. Small bowel is nondilated. The appendix is not clearly seen in the right lower quadrant but no pericecal stranding or fluid. Vascular/Lymphatic: No significant vascular findings are present. No enlarged abdominal or pelvic lymph nodes. Reproductive: Retroverted uterus. Trace free fluid in the pelvis. This could be physiologic. No separate adnexal mass Other: No free intra-abdominal air. No umbilical or inguinal hernia. Musculoskeletal: No acute or significant osseous findings. IMPRESSION: Patchy heterogeneous abnormal  enhancement of the right kidney with the adjacent stranding. Findings are most consistent with infection such as pyelonephritis. Recommend follow-up to confirm clearance. No bowel obstruction, free air. Trace nonspecific free fluid in the pelvis which could be physiologic. Electronically Signed   By: Karen Kays M.D.   On: 11/20/2022 11:05    Procedures Procedures    Medications Ordered in ED Medications  acetaminophen (TYLENOL) tablet 650 mg (650 mg Oral Given 11/20/22 0937)  sodium chloride 0.9 % bolus 1,000 mL (0 mLs Intravenous Stopped 11/20/22 1228)  ondansetron (ZOFRAN) injection 4 mg (4 mg Intravenous Given 11/20/22 1028)  morphine (PF) 4 MG/ML injection 4 mg (4 mg Intravenous Given 11/20/22 1028)  iohexol (OMNIPAQUE) 300 MG/ML solution 100 mL (80 mLs Intravenous Contrast Given 11/20/22 1032)  cefTRIAXone (ROCEPHIN) 2 g in sodium chloride 0.9 % 100 mL IVPB (2 g Intravenous New Bag/Given 11/20/22 1315)    ED Course/ Medical Decision Making/ A&P Clinical Course as of 11/20/22 1345  Thu Nov 20, 2022  0952 WBC(!): 20.7 [JS]    Clinical Course User Index [JS] Claude Manges, PA-C                             Medical Decision Making Amount and/or Complexity of Data Reviewed Labs: ordered. Decision-making details documented in ED Course. Radiology: ordered.  Risk OTC drugs. Prescription drug management.   This patient presents to the ED for concern of abdominal pain, this involves a number of treatment options, and is a complaint that carries with it a high risk of complications and morbidity.  The differential diagnosis includes appendicitis, renal colic, cholecystitis versus    Co morbidities: Discussed in HPI   Brief History:  See HPI.   EMR reviewed including pt PMHx, past surgical history and past visits to ER.   See HPI for more details   Lab Tests:  I ordered and independently interpreted labs.  The pertinent results include:    Labs notable for CBC with a  leukocytosis of 21.7, hemoglobin stable.  CMP with no electrolyte derangement, creatinine levels unremarkable.  LFTs are within normal limits.  Lactic acid is negative.   Imaging Studies:  CT Abdomen showed: Patchy heterogeneous abnormal enhancement of the right kidney with  the adjacent stranding. Findings are most consistent with infection  such as pyelonephritis. Recommend follow-up to confirm clearance.    No bowel obstruction, free air. Trace nonspecific free fluid in the  pelvis which could be physiologic.  Medicines ordered:  I ordered medication including zofran, bolus, morphine  for symptomatic treatment Reevaluation of the patient after these medicines showed that the patient improved I have reviewed the patients home medicines and have made adjustments as needed  Reevaluation:  After the interventions noted above I re-evaluated patient and found that they have :improved   Social Determinants of Health:  The patient's social determinants of health were a factor in the care of this patient  Problem List / ED Course:  Patient here with 1 week of abdominal pain which was worse when it started with a Tmax of 104, evaluated by urgent care today as pain has no return, pain appears to be focal around the right lower quadrant, she is also endorsing some anorexia.  Also feeling nauseous but has not had any episodes of vomiting.  Last bowel movement was this morning, arrived to the ED febrile with a temperature of 100.2, given Tylenol upon arrival.  Blood work remarkable for CBC with a leukocytosis of 20.7, CMP is unremarkable with a normal creatinine.  She is having also some right CVA tenderness, urinalysis was also added.  Currently on her menstrual cycle with a prior hx of ovarian cyst.  CT remarkable for pyelonephritis, lactic acid is negative.  She is tolerating p.o., did receive 2 g of Rocephin while in the ED.  We did send her urine for culture, we discussed treatment of  pyelonephritis with a short course of Bactrim, will go home with supportive treatment such as naproxen along with Zofran.  Patient does report improvement in symptoms, she is hemodynamically stable for discharge.  Dispostion:  After consideration of the diagnostic results and the patients response to treatment, I feel that the patent would benefit from treatment of pyelonephritis.   Portions of this note were generated with Scientist, clinical (histocompatibility and immunogenetics). Dictation errors may occur despite best attempts at proofreading.   Final Clinical Impression(s) / ED Diagnoses Final diagnoses:  Pyelonephritis    Rx / DC Orders ED Discharge Orders          Ordered    sulfamethoxazole-trimethoprim (BACTRIM DS) 800-160 MG tablet  2 times daily        11/20/22 1228    ondansetron (ZOFRAN) 4 MG tablet  Every 6 hours        11/20/22 1327    naproxen (NAPROSYN) 375 MG tablet  2 times daily        11/20/22 1327              Claude Manges, PA-C 11/20/22 1345    Margarita Grizzle, MD 11/24/22 [phone]

## 2022-11-20 NOTE — ED Notes (Signed)
Pt verbalized understanding of d/c instructions, meds, and followup care. Denies questions. VSS, no distress noted. Steady gait to exit with all belongings.  ?

## 2022-11-20 NOTE — ED Notes (Signed)
Pt helped into gown, blankets given

## 2022-11-20 NOTE — ED Triage Notes (Signed)
Pt arrives pov, steady gait with c/o RLQ pain radiating to RT flank x 2 days. Similar x 1 week pta that resolved without tx. Referred by UC

## 2022-11-21 LAB — URINE CULTURE

## 2022-11-22 LAB — URINE CULTURE: Culture: 30000 — AB

## 2022-11-23 ENCOUNTER — Telehealth (HOSPITAL_BASED_OUTPATIENT_CLINIC_OR_DEPARTMENT_OTHER): Payer: Self-pay | Admitting: *Deleted

## 2022-11-23 NOTE — Progress Notes (Signed)
ED Antimicrobial Stewardship Positive Culture Follow Up   Natasha Woods is an 27 y.o. female who presented to [facility] on 11/20/2022 with a chief complaint of  Chief Complaint  Patient presents with   Abdominal Pain    Recent Results (from the past 720 hour(s))  Urine Culture     Status: Abnormal   Collection Time: 11/20/22  1:03 PM   Specimen: Urine, Clean Catch  Result Value Ref Range Status   Specimen Description   Final    URINE, CLEAN CATCH Performed at [facility], [address], [place], [place] [address]    Special Requests   Final    NONE Performed at [facility], [address] [place], [place] [address]    Culture (A)  Final    30,000 COLONIES/mL ESCHERICHIA COLI 10,000 COLONIES/mL STAPHYLOCOCCUS EPIDERMIDIS    Report Status 11/22/2022 FINAL  Final   Organism ID, Bacteria ESCHERICHIA COLI (A)  Final   Organism ID, Bacteria STAPHYLOCOCCUS EPIDERMIDIS (A)  Final      Susceptibility   Escherichia coli - MIC*    AMPICILLIN >=32 RESISTANT Resistant     CEFAZOLIN <=4 SENSITIVE Sensitive     CEFEPIME <=0.12 SENSITIVE Sensitive     CEFTRIAXONE <=0.25 SENSITIVE Sensitive     CIPROFLOXACIN <=0.25 SENSITIVE Sensitive     GENTAMICIN <=1 SENSITIVE Sensitive     IMIPENEM <=0.25 SENSITIVE Sensitive     NITROFURANTOIN <=16 SENSITIVE Sensitive     TRIMETH/SULFA >=320 RESISTANT Resistant     AMPICILLIN/SULBACTAM >=32 RESISTANT Resistant     PIP/TAZO <=4 SENSITIVE Sensitive     * 30,000 COLONIES/mL ESCHERICHIA COLI   Staphylococcus epidermidis - MIC*    CIPROFLOXACIN >=8 RESISTANT Resistant     GENTAMICIN <=0.5 SENSITIVE Sensitive     NITROFURANTOIN <=16 SENSITIVE Sensitive     OXACILLIN <=0.25 SENSITIVE Sensitive     TETRACYCLINE <=1 SENSITIVE Sensitive     VANCOMYCIN 1 SENSITIVE Sensitive     TRIMETH/SULFA <=10 SENSITIVE Sensitive     CLINDAMYCIN >=8 RESISTANT Resistant     RIFAMPIN <=0.5 SENSITIVE Sensitive     Inducible  Clindamycin NEGATIVE Sensitive     * 10,000 COLONIES/mL STAPHYLOCOCCUS EPIDERMIDIS   [x]  Treated with Bactrim, organism resistant to prescribed antimicrobial. Amoxicillin allergy (hive) noted. Patient tolerated ceftriaxone in ED encounter.   STOP Bactrim START: Cefpodoxime 200 mg BID x 14 days (Qty 28; Refills 0)  ED Provider: Theron Arista PA  Delmar Landau, PharmD, BCPS 11/23/2022 11:05 AM ED Clinical Pharmacist -  [phone]

## 2022-11-23 NOTE — Telephone Encounter (Signed)
Post ED Visit - Positive Culture Follow-up: Successful Patient Follow-Up  Culture assessed and recommendations reviewed by:  []  Enzo Bi, Pharm.D. []  Celedonio Miyamoto, Pharm.D., BCPS AQ-ID []  Garvin Fila, Pharm.D., BCPS []  Georgina Pillion, Pharm.D., BCPS []  [place], [address].D., BCPS, AAHIVP []  Estella Husk, Pharm.D., BCPS, AAHIVP []  Lysle Pearl, PharmD, BCPS []  Phillips Climes, PharmD, BCPS []  Agapito Games, PharmD, BCPS [x]  Delmar Landau, PharmD  Positive urine culture  []  Patient discharged without antimicrobial prescription and treatment is now indicated [x]  Organism is resistant to prescribed ED discharge antimicrobial []  Patient with positive blood cultures  Changes discussed with ED provider: Theron Arista, PA-C New antibiotic prescription Cefpodoxime 200mg  BID x 14 days. Stop Bactrim Called to [facility], [facility] [place]  Contacted patient, date 11/23/22, time 1430   Natasha Woods 11/23/2022, 2:32 PM

## 2023-01-14 ENCOUNTER — Encounter: Payer: Self-pay | Admitting: Nurse Practitioner

## 2023-01-19 ENCOUNTER — Encounter: Payer: Self-pay | Admitting: Nurse Practitioner

## 2023-01-19 ENCOUNTER — Ambulatory Visit (INDEPENDENT_AMBULATORY_CARE_PROVIDER_SITE_OTHER): Payer: BC Managed Care – PPO | Admitting: Nurse Practitioner

## 2023-01-19 VITALS — BP 100/60 | HR 96 | Temp 98.1°F | Ht 64.5 in | Wt 129.8 lb

## 2023-01-19 DIAGNOSIS — Z87448 Personal history of other diseases of urinary system: Secondary | ICD-10-CM

## 2023-01-19 DIAGNOSIS — Z130 Encounter for screening for diseases of the blood and blood-forming organs and certain disorders involving the immune mechanism: Secondary | ICD-10-CM

## 2023-01-19 DIAGNOSIS — N920 Excessive and frequent menstruation with regular cycle: Secondary | ICD-10-CM

## 2023-01-19 DIAGNOSIS — Z0000 Encounter for general adult medical examination without abnormal findings: Secondary | ICD-10-CM

## 2023-01-19 DIAGNOSIS — Z13220 Encounter for screening for lipoid disorders: Secondary | ICD-10-CM | POA: Diagnosis not present

## 2023-01-19 DIAGNOSIS — Z79899 Other long term (current) drug therapy: Secondary | ICD-10-CM

## 2023-01-19 DIAGNOSIS — E559 Vitamin D deficiency, unspecified: Secondary | ICD-10-CM

## 2023-01-19 DIAGNOSIS — Z1329 Encounter for screening for other suspected endocrine disorder: Secondary | ICD-10-CM | POA: Diagnosis not present

## 2023-01-19 DIAGNOSIS — J309 Allergic rhinitis, unspecified: Secondary | ICD-10-CM

## 2023-01-19 DIAGNOSIS — N921 Excessive and frequent menstruation with irregular cycle: Secondary | ICD-10-CM

## 2023-01-19 DIAGNOSIS — Z6822 Body mass index (BMI) 22.0-22.9, adult: Secondary | ICD-10-CM

## 2023-01-19 DIAGNOSIS — Z0001 Encounter for general adult medical examination with abnormal findings: Secondary | ICD-10-CM

## 2023-01-19 DIAGNOSIS — Z131 Encounter for screening for diabetes mellitus: Secondary | ICD-10-CM

## 2023-01-19 MED ORDER — IBUPROFEN 800 MG PO TABS
800.0000 mg | ORAL_TABLET | Freq: Three times a day (TID) | ORAL | 0 refills | Status: AC | PRN
Start: 2023-01-19 — End: ?

## 2023-01-19 NOTE — Patient Instructions (Signed)

## 2023-01-19 NOTE — Progress Notes (Signed)
Complete Physical  Assessment and Plan:  Natasha Woods was seen today for annual exam.  Diagnoses and all orders for this visit:  Encounter for routine adult health examination without abnormal findings Discussed STD testing, safe sex, alcohol and drug awareness, drinking and driving dangers, wearing a seat belt and general safety measures for young adult. Defer Pap until next year.  Medication management All medications discussed and reviewed in full. All questions and concerns regarding medications addressed.    Screening for thyroid disorder -     TSH  BMI 22.0-22.9, adult Discussed appropriate BMI Diet modification. Physical activity. Encouraged/praised to build confidence.  Screening, lipid -     Lipid panel  Menometrorrhagia Continue IBU 800 mg PRN Not currently asymptomatic  Hgb reviewed 11/20/22 WNL  Defers Hbg levels today. Continue to monitor  Vitamin D deficiency Continue supplement for goal of 60-100 Monitor Vitamin D levels  Allergic Rhinitis Controlled Continue Albuterol PRN, Flonase Continue OTC antihistamine PRN Avoid triggers - discussed benefit of local honey.  Screening for DM  - A1c   - Insulin   History of pyelonephritis Currently not symptomatic Continue 64 oz fluid daily Continue to monitor  Orders Placed This Encounter  Procedures   Lipid panel   TSH   Hemoglobin A1c   Insulin, random   VITAMIN D 25 Hydroxy (Vit-D Deficiency, Fractures)   Meds ordered this encounter  Medications   ibuprofen (ADVIL) 800 MG tablet    Sig: Take 1 tablet (800 mg total) by mouth every 8 (eight) hours as needed for cramping.    Dispense:  90 tablet    Refill:  0    Order Specific Question:   Supervising Provider    Answer:   Lucky Cowboy [phone]   Notify office for further evaluation and treatment, questions or concerns if any reported s/s fail to improve.   The patient was advised to call back or seek an in-person evaluation if any symptoms  worsen or if the condition fails to improve as anticipated.   Further disposition pending results of labs. Discussed med's effects and SE's.    I discussed the assessment and treatment plan with the patient. The patient was provided an opportunity to ask questions and all were answered. The patient agreed with the plan and demonstrated an understanding of the instructions.  Discussed med's effects and SE's. Screening labs and tests as requested with regular follow-up as recommended.  I provided 35 minutes of face-to-face time during this encounter including counseling, chart review, and critical decision making was preformed.  Today's Plan of Care is based on a patient-centered health care approach known as shared decision making - the decisions, tests and treatments allow for patient preferences and values to be balanced with clinical evidence.     Future Appointments  Date Time Provider Department Center  01/19/2024 10:00 AM Adela Glimpse, NP [facility] None     HPI  This very nice 27 y.o.female presents for complete physical. She has Allergic rhinitis; Menorrhagia with regular cycle; Vitamin D deficiency; Medication management; B12 deficiency; Abnormal vaginal bleeding; and Acute blood loss anemia on their problem list.   Patient reports no complaints at this time.   She is recently married x1 week.  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  She continues with foster placement, 25 year old girl and 14 month on son.   She was seen in the ER on 10/2022 for pyelonephritis.  Blood work was overall stable.  She is  requesting minimal labs today.  She continues to stay well hydrated with 64 oz water daily.   Menses are normal and irregular, however consisently averages between 21-23 days. Seems to be heavy first two days then lighter. States GYN once noted endometriosis, however, does not want to taking birth control, tried before, did not like SE.  She  takes 800 mg IBU at start of menses. LMP 12/28/22.  Lab Results  Component Value Date   HGB 12.7 11/20/2022   Has year round allergies, prone to severe sinus infections. Takes zyrtec, flonase, mucinex, etc with fair results.   BMI is Body mass index is 21.94 kg/m., she has been working on diet and exercise. Wt Readings from Last 3 Encounters:  01/19/23 129 lb 12.8 oz (58.9 kg)  11/20/22 135 lb (61.2 kg)  12/04/21 136 lb (61.7 kg)    Her blood pressure has been controlled at home, today their BP is BP: 100/60  She does workout,  She denies chest pain, shortness of breath, dizziness.   She is not on cholesterol medication and denies myalgias. Her cholesterol is at goal. The cholesterol last visit was:   Lab Results  Component Value Date   CHOL 181 02/25/2021   HDL 59 02/25/2021   LDLCALC 106 (H) 02/25/2021   TRIG 74 02/25/2021   CHOLHDL 3.1 02/25/2021   Last X9J in the office was:  Lab Results  Component Value Date   HGBA1C 4.8 04/08/2021      Current Medications:  Current Outpatient Medications on File Prior to Visit  Medication Sig Dispense Refill   albuterol (VENTOLIN HFA) 108 (90 Base) MCG/ACT inhaler Inhale 2 puffs into the lungs every 4 (four) hours as needed for wheezing or shortness of breath. 1 each 0   cetirizine (ZYRTEC) 10 MG tablet Take 10 mg by mouth daily.     dexamethasone (DECADRON) 1 MG tablet Take 3 tabs for 3 days, 2 tabs for 3 days 1 tab for 5 days. Take with food. 20 tablet 0   ferrous sulfate 325 (65 FE) MG tablet Take 325 mg by mouth daily with breakfast. Patient thinks she is taking 65mg  now     fluticasone (FLONASE) 50 MCG/ACT nasal spray Place 2 sprays into both nostrils at bedtime. 16 g 1   Multiple Vitamins-Minerals (MULTIVITAMIN PO) Take by mouth daily.     pseudoephedrine (SUDAFED) 120 MG 12 hr tablet Take  1 tablet  2 x /day (every 12 hours)  for Sinus & Ear Congestion 60 tablet 3   No current facility-administered medications on file prior  to visit.   Health Maintenance:   Immunization History  Administered Date(s) Administered   HPV 9-valent 03/15/2015, 05/28/2015, 09/06/2015   Meningococcal Conjugate 05/30/2014   PFIZER(Purple Top)SARS-COV-2 Vaccination 10/01/2019, 10/22/2019, 07/30/2020   PPD Test 10/19/2017, 02/16/2020   Tdap 07/28/2012    TD/TDAP: 2014 Influenza: 2023 on campus Pneumovax: N/A  Prevnar 13: N/A HPV vaccines: completed meningitis vaccine 2015 Covid 19: 2/2, 2021, pfizer   LMP: 12/28/22 Sexually Active: STD and PAP declined and defer to  next year. MGM: N/A  Allergies:  Allergies  Allergen Reactions   Amoxicillin Hives   Medical History:  has Allergic rhinitis; Menorrhagia with regular cycle; Vitamin D deficiency; Medication management; B12 deficiency; Abnormal vaginal bleeding; and Acute blood loss anemia on their problem list. Surgical History:  She  has a past surgical history that includes Wisdom tooth extraction (2016). Family History:  Her family history includes Cancer in her maternal grandfather  and paternal grandfather; Dementia in her paternal grandmother; Hashimoto's thyroiditis in her sister; Irritable bowel syndrome in her maternal grandmother and paternal grandmother; Skin cancer in her maternal grandfather; Thyroid disease in her mother. Social History:   reports that she has never smoked. She has never used smokeless tobacco. She reports that she does not currently use alcohol. She reports that she does not use drugs.  Review of Systems: Review of Systems  Constitutional: Negative.  Negative for chills, fever, malaise/fatigue and weight loss.  HENT: Negative.  Negative for congestion, hearing loss, sinus pain, sore throat and tinnitus.   Eyes: Negative.  Negative for blurred vision and double vision.  Respiratory: Negative.  Negative for cough, hemoptysis, sputum production, shortness of breath and wheezing.   Cardiovascular: Negative.  Negative for chest pain, palpitations,  orthopnea, claudication and leg swelling.  Gastrointestinal: Negative.  Negative for abdominal pain, blood in stool, constipation, diarrhea, heartburn, melena, nausea and vomiting.  Genitourinary: Negative.  Negative for dysuria and urgency.       Irregular spotting and cramping with periods  Musculoskeletal: Negative.  Negative for back pain, falls, joint pain, myalgias and neck pain.  Skin: Negative.  Negative for rash.  Neurological:  Negative for dizziness, tingling, tremors, sensory change, weakness and headaches.  Endo/Heme/Allergies:  Negative for polydipsia. Does not bruise/bleed easily.  Psychiatric/Behavioral: Negative.  Negative for depression and suicidal ideas. The patient is not nervous/anxious and does not have insomnia.   All other systems reviewed and are negative.   Physical Exam: Estimated body mass index is 21.94 kg/m as calculated from the following:   Height as of this encounter: 5' 4.5" (1.638 m).   Weight as of this encounter: 129 lb 12.8 oz (58.9 kg). BP 100/60   Pulse 96   Temp 98.1 F (36.7 C)   Ht 5' 4.5" (1.638 m)   Wt 129 lb 12.8 oz (58.9 kg)   SpO2 98%   BMI 21.94 kg/m  General Appearance: Well nourished, in no apparent distress.  Eyes: PERRLA, EOMs, conjunctiva no swelling or erythema Sinuses: No Frontal/maxillary tenderness  ENT/Mouth: Ext aud canals clear, normal light reflex with TMs without erythema, bulging. Good dentition. No erythema, swelling, or exudate on post pharynx. Tonsils not swollen or erythematous. Hearing normal.  Neck: Supple, thyroid normal. No bruits  Respiratory: Respiratory effort normal, BS equal bilaterally without rales, rhonchi, wheezing or stridor.  Cardio: RRR without murmurs, rubs or gallops. Brisk peripheral pulses without edema.  Chest: symmetric, with normal excursions and percussion.  Breasts: Defer Abdomen: Soft, nontender, no guarding, rebound, hernias, masses, or organomegaly.  Lymphatics: Non tender without  lymphadenopathy.  Genitourinary: defer Musculoskeletal: Full ROM all peripheral extremities,5/5 strength, and normal gait.  Popping sound of right knee with movement , nontender Skin: Warm, dry without rashes, lesions, ecchymosis. Neuro: Cranial nerves intact, reflexes equal bilaterally. Normal muscle tone, no cerebellar symptoms. Sensation intact.  Psych: Awake and oriented X 3, normal affect, Insight and Judgment appropriate.   EKG: defer  Natasha Woods 11:01 AM [facility]

## 2023-01-20 LAB — LIPID PANEL
Cholesterol: 168 mg/dL (ref <200)
HDL: 58 mg/dL (ref >=50)
LDL Cholesterol (Calc): 94 mg/dL (calc)
Non-HDL Cholesterol (Calc): 110 mg/dL (calc) (ref <130)
Total CHOL/HDL Ratio: 2.9 (calc) (ref <5.0)
Triglycerides: 69 mg/dL (ref <150)

## 2023-01-20 LAB — HEMOGLOBIN A1C
Hgb A1c MFr Bld: 4.8 % of total Hgb (ref <5.7)
Mean Plasma Glucose: 91 mg/dL
eAG (mmol/L): 5 mmol/L

## 2023-01-20 LAB — VITAMIN D 25 HYDROXY (VIT D DEFICIENCY, FRACTURES): Vit D, 25-Hydroxy: 26 ng/mL — ABNORMAL LOW (ref 30–100)

## 2023-01-20 LAB — TSH: TSH: 1.14 mIU/L

## 2023-01-20 LAB — INSULIN, RANDOM: Insulin: 16.1 u[IU]/mL

## 2023-02-26 ENCOUNTER — Encounter: Payer: BC Managed Care – PPO | Admitting: Nurse Practitioner

## 2024-01-19 ENCOUNTER — Encounter: Payer: Self-pay | Admitting: Nurse Practitioner
